# Patient Record
Sex: Female | Born: 1975 | Race: White | Hispanic: No | Smoking: Current every day smoker
Health system: Southern US, Community
[De-identification: ages and names within clinical notes are randomized; demographics above are authoritative.]

## PROBLEM LIST (undated history)

## (undated) DIAGNOSIS — N611 Abscess of the breast and nipple: Secondary | ICD-10-CM

## (undated) DIAGNOSIS — I10 Essential (primary) hypertension: Secondary | ICD-10-CM

## (undated) DIAGNOSIS — J189 Pneumonia, unspecified organism: Secondary | ICD-10-CM

## (undated) DIAGNOSIS — R569 Unspecified convulsions: Secondary | ICD-10-CM

## (undated) DIAGNOSIS — F1010 Alcohol abuse, uncomplicated: Secondary | ICD-10-CM

## (undated) DIAGNOSIS — Z720 Tobacco use: Secondary | ICD-10-CM

## (undated) DIAGNOSIS — A4902 Methicillin resistant Staphylococcus aureus infection, unspecified site: Secondary | ICD-10-CM

## (undated) HISTORY — PX: INCISION AND DRAINAGE: SHX5863

## (undated) HISTORY — PX: TUBAL LIGATION: SHX77

## (undated) HISTORY — PX: OTHER SURGICAL HISTORY: SHX169

---

## 2005-06-30 ENCOUNTER — Emergency Department: Payer: Self-pay | Admitting: Emergency Medicine

## 2005-07-01 ENCOUNTER — Emergency Department: Payer: Self-pay | Admitting: Emergency Medicine

## 2005-07-28 ENCOUNTER — Emergency Department: Payer: Self-pay | Admitting: Emergency Medicine

## 2006-01-17 ENCOUNTER — Emergency Department: Payer: Self-pay | Admitting: General Practice

## 2008-05-03 ENCOUNTER — Emergency Department: Payer: Self-pay | Admitting: Emergency Medicine

## 2008-06-08 ENCOUNTER — Emergency Department: Payer: Self-pay | Admitting: Emergency Medicine

## 2008-06-19 ENCOUNTER — Emergency Department: Payer: Self-pay | Admitting: Emergency Medicine

## 2008-06-20 ENCOUNTER — Other Ambulatory Visit: Payer: Self-pay

## 2008-07-27 ENCOUNTER — Emergency Department: Payer: Self-pay

## 2008-09-14 ENCOUNTER — Emergency Department: Payer: Self-pay | Admitting: Emergency Medicine

## 2008-10-28 ENCOUNTER — Emergency Department: Payer: Self-pay | Admitting: Emergency Medicine

## 2010-02-22 ENCOUNTER — Emergency Department: Payer: Self-pay

## 2010-05-02 ENCOUNTER — Emergency Department: Payer: Self-pay | Admitting: Internal Medicine

## 2010-06-05 ENCOUNTER — Emergency Department: Payer: Self-pay | Admitting: Emergency Medicine

## 2011-02-01 ENCOUNTER — Inpatient Hospital Stay: Payer: Self-pay | Admitting: Psychiatry

## 2018-04-06 ENCOUNTER — Emergency Department (HOSPITAL_BASED_OUTPATIENT_CLINIC_OR_DEPARTMENT_OTHER): Payer: Self-pay

## 2018-04-06 ENCOUNTER — Inpatient Hospital Stay (HOSPITAL_BASED_OUTPATIENT_CLINIC_OR_DEPARTMENT_OTHER)
Admission: EM | Admit: 2018-04-06 | Discharge: 2018-04-10 | DRG: 871 | Disposition: A | Discharge status: Home / Self Care | Payer: Self-pay | Attending: Internal Medicine | Admitting: Internal Medicine

## 2018-04-06 ENCOUNTER — Other Ambulatory Visit: Payer: Self-pay

## 2018-04-06 ENCOUNTER — Encounter (HOSPITAL_BASED_OUTPATIENT_CLINIC_OR_DEPARTMENT_OTHER): Payer: Self-pay | Admitting: *Deleted

## 2018-04-06 DIAGNOSIS — Z87898 Personal history of other specified conditions: Secondary | ICD-10-CM

## 2018-04-06 DIAGNOSIS — F10939 Alcohol use, unspecified with withdrawal, unspecified: Secondary | ICD-10-CM

## 2018-04-06 DIAGNOSIS — R062 Wheezing: Secondary | ICD-10-CM

## 2018-04-06 DIAGNOSIS — F17210 Nicotine dependence, cigarettes, uncomplicated: Secondary | ICD-10-CM | POA: Diagnosis present

## 2018-04-06 DIAGNOSIS — E876 Hypokalemia: Secondary | ICD-10-CM | POA: Diagnosis present

## 2018-04-06 DIAGNOSIS — N611 Abscess of the breast and nipple: Secondary | ICD-10-CM | POA: Diagnosis present

## 2018-04-06 DIAGNOSIS — A419 Sepsis, unspecified organism: Principal | ICD-10-CM | POA: Diagnosis present

## 2018-04-06 DIAGNOSIS — Z809 Family history of malignant neoplasm, unspecified: Secondary | ICD-10-CM

## 2018-04-06 DIAGNOSIS — J180 Bronchopneumonia, unspecified organism: Secondary | ICD-10-CM | POA: Diagnosis present

## 2018-04-06 DIAGNOSIS — R0902 Hypoxemia: Secondary | ICD-10-CM | POA: Diagnosis present

## 2018-04-06 DIAGNOSIS — F10239 Alcohol dependence with withdrawal, unspecified: Secondary | ICD-10-CM | POA: Diagnosis present

## 2018-04-06 DIAGNOSIS — J181 Lobar pneumonia, unspecified organism: Secondary | ICD-10-CM

## 2018-04-06 DIAGNOSIS — Z720 Tobacco use: Secondary | ICD-10-CM

## 2018-04-06 DIAGNOSIS — D649 Anemia, unspecified: Secondary | ICD-10-CM | POA: Diagnosis present

## 2018-04-06 DIAGNOSIS — J189 Pneumonia, unspecified organism: Secondary | ICD-10-CM | POA: Diagnosis present

## 2018-04-06 DIAGNOSIS — E872 Acidosis: Secondary | ICD-10-CM | POA: Diagnosis present

## 2018-04-06 HISTORY — DX: Alcohol abuse, uncomplicated: F10.10

## 2018-04-06 HISTORY — DX: Tobacco use: Z72.0

## 2018-04-06 HISTORY — DX: Abscess of the breast and nipple: N61.1

## 2018-04-06 LAB — I-STAT CG4 LACTIC ACID, ED: Lactic Acid, Venous: 3.7 mmol/L (ref 0.5–1.9)

## 2018-04-06 LAB — CBC WITH DIFFERENTIAL/PLATELET
BASOS PCT: 0 %
Basophils Absolute: 0 10*3/uL (ref 0.0–0.1)
EOS PCT: 0 %
Eosinophils Absolute: 0 10*3/uL (ref 0.0–0.7)
HEMATOCRIT: 34.4 % — AB (ref 36.0–46.0)
Hemoglobin: 11.5 g/dL — ABNORMAL LOW (ref 12.0–15.0)
Lymphocytes Relative: 9 %
Lymphs Abs: 1.9 10*3/uL (ref 0.7–4.0)
MCH: 30.3 pg (ref 26.0–34.0)
MCHC: 33.4 g/dL (ref 30.0–36.0)
MCV: 90.8 fL (ref 78.0–100.0)
MONO ABS: 2.4 10*3/uL — AB (ref 0.1–1.0)
Monocytes Relative: 11 %
NEUTROS ABS: 17.2 10*3/uL — AB (ref 1.7–7.7)
Neutrophils Relative %: 80 %
Platelets: 339 10*3/uL (ref 150–400)
RBC: 3.79 MIL/uL — ABNORMAL LOW (ref 3.87–5.11)
RDW: 22.1 % — AB (ref 11.5–15.5)
WBC: 21.5 10*3/uL — ABNORMAL HIGH (ref 4.0–10.5)

## 2018-04-06 LAB — COMPREHENSIVE METABOLIC PANEL
ALT: 11 U/L — AB (ref 14–54)
AST: 24 U/L (ref 15–41)
Albumin: 3.4 g/dL — ABNORMAL LOW (ref 3.5–5.0)
Alkaline Phosphatase: 116 U/L (ref 38–126)
Anion gap: 14 (ref 5–15)
BUN: <5 mg/dL — ABNORMAL LOW (ref 6–20)
CHLORIDE: 100 mmol/L — AB (ref 101–111)
CO2: 21 mmol/L — ABNORMAL LOW (ref 22–32)
CREATININE: 0.62 mg/dL (ref 0.44–1.00)
Calcium: 8.8 mg/dL — ABNORMAL LOW (ref 8.9–10.3)
GFR calc Af Amer: >60 mL/min (ref >60)
GFR calc non Af Amer: >60 mL/min (ref >60)
Glucose, Bld: 126 mg/dL — ABNORMAL HIGH (ref 65–99)
Potassium: 3.1 mmol/L — ABNORMAL LOW (ref 3.5–5.1)
Sodium: 135 mmol/L (ref 135–145)
Total Bilirubin: 1 mg/dL (ref 0.3–1.2)
Total Protein: 7.8 g/dL (ref 6.5–8.1)

## 2018-04-06 MED ORDER — LEVOFLOXACIN IN D5W 500 MG/100ML IV SOLN
500.0000 mg | Freq: Once | INTRAVENOUS | Status: AC
  Administered 2018-04-06: 500 mg via INTRAVENOUS
  Filled 2018-04-06: qty 100

## 2018-04-06 MED ORDER — SODIUM CHLORIDE 0.9 % IV BOLUS
1000.0000 mL | Freq: Once | INTRAVENOUS | Status: AC
  Administered 2018-04-06: 1000 mL via INTRAVENOUS

## 2018-04-06 MED ORDER — ACETAMINOPHEN 325 MG PO TABS
650.0000 mg | ORAL_TABLET | Freq: Once | ORAL | Status: AC
  Administered 2018-04-06: 650 mg via ORAL
  Filled 2018-04-06: qty 2

## 2018-04-06 MED ORDER — IPRATROPIUM-ALBUTEROL 0.5-2.5 (3) MG/3ML IN SOLN
3.0000 mL | Freq: Once | RESPIRATORY_TRACT | Status: AC
  Administered 2018-04-06: 3 mL via RESPIRATORY_TRACT
  Filled 2018-04-06: qty 3

## 2018-04-06 NOTE — ED Notes (Signed)
ED Provider at bedside. 

## 2018-04-06 NOTE — ED Triage Notes (Signed)
Pt reports cough and SOB that started today. No acute distress noted. Ambulatory.

## 2018-04-06 NOTE — ED Provider Notes (Signed)
[facility] EMERGENCY DEPARTMENT Provider Note   CSN: [id number] Arrival date & time: 04/06/18  2114     History   Chief Complaint Chief Complaint  Patient presents with  . URI    HPI Megan Solis is a 42 y.o. female.  HPI  This is a 42 year old female who presents with cough and shortness of breath.  Patient reports onset of symptoms several days ago.  She states "I have had a cold."  She reports a nonproductive cough.  She reports worsening shortness of breath and chest tightness that worsened prior to arrival.  She is a current every day smoker.  She has a family member that has had similar symptoms.  She reports some posttussive emesis.  Denies any diarrhea.  Reports chills without documented fevers.  History reviewed. No pertinent past medical history.  Patient Active Problem List   Diagnosis Date Noted  . Sepsis (HCC) 04/07/2018    History reviewed. No pertinent surgical history.   OB History   None      Home Medications    Prior to Admission medications   Not on File    Family History History reviewed. No pertinent family history.  Social History Social History   Tobacco Use  . Smoking status: Current Every Day Smoker    Packs/day: 1.00    Types: Cigarettes  . Smokeless tobacco: Never Used  Substance Use Topics  . Alcohol use: Yes  . Drug use: Not Currently     Allergies   Patient has no known allergies.   Review of Systems Review of Systems  Constitutional: Positive for chills and fever.  Respiratory: Positive for cough, chest tightness, shortness of breath and wheezing.   Cardiovascular: Negative for chest pain and leg swelling.  Gastrointestinal: Positive for vomiting. Negative for abdominal pain, diarrhea and nausea.  Genitourinary: Negative for dysuria.  Musculoskeletal: Negative for neck stiffness.  Neurological: Negative for headaches.  All other systems reviewed and are negative.    Physical Exam Updated  Vital Signs BP 104/82   Pulse (!) 132   Temp 99.5 F (37.5 C) (Oral)   Resp (!) 32   Ht 5\' 4"  (1.626 m)   Wt 72.6 kg (160 lb)   LMP 04/04/2018   SpO2 94%   BMI 27.46 kg/m   Physical Exam  Constitutional: She is oriented to person, place, and time. No distress.  Non-ill-appearing  HENT:  Head: Normocephalic and atraumatic.  Eyes: Pupils are equal, round, and reactive to light.  Cardiovascular: Regular rhythm and normal heart sounds.  Tachycardia  Pulmonary/Chest: Effort normal. No respiratory distress. She has wheezes.  Expiratory wheezing noted in all lung fields, fair air movement, Rales right lower lobe  Abdominal: Soft. Bowel sounds are normal. There is no tenderness.  Musculoskeletal: She exhibits no edema.  Neurological: She is alert and oriented to person, place, and time.  Skin: Skin is warm and dry.  Psychiatric: She has a normal mood and affect.  Nursing note and vitals reviewed.    ED Treatments / Results  Labs (all labs ordered are listed, but only abnormal results are displayed) Labs Reviewed  COMPREHENSIVE METABOLIC PANEL - Abnormal; Notable for the following components:      Result Value   Potassium 3.1 (*)    Chloride 100 (*)    CO2 21 (*)    Glucose, Bld 126 (*)    BUN <5 (*)    Calcium 8.8 (*)    Albumin 3.4 (*)  ALT 11 (*)    All other components within normal limits  CBC WITH DIFFERENTIAL/PLATELET - Abnormal; Notable for the following components:   WBC 21.5 (*)    RBC 3.79 (*)    Hemoglobin 11.5 (*)    HCT 34.4 (*)    RDW 22.1 (*)    Neutro Abs 17.2 (*)    Monocytes Absolute 2.4 (*)    All other components within normal limits  I-STAT CG4 LACTIC ACID, ED - Abnormal; Notable for the following components:   Lactic Acid, Venous 3.70 (*)    All other components within normal limits  I-STAT CG4 LACTIC ACID, ED - Abnormal; Notable for the following components:   Lactic Acid, Venous 3.04 (*)    All other components within normal limits    URINALYSIS, ROUTINE W REFLEX MICROSCOPIC  INFLUENZA PANEL BY PCR (TYPE A & B)    EKG EKG Interpretation  Date/Time:  Sunday April 06 2018 23:04:36 EDT Ventricular Rate:  124 PR Interval:    QRS Duration: 82 QT Interval:  313 QTC Calculation: 450 R Axis:   62 Text Interpretation:  Sinus tachycardia Ventricular premature complex Aberrant conduction of SV complex(es) Probable left atrial enlargement Anteroseptal infarct, old Minimal ST depression, inferior leads Likely rate related Confirmed by Ross Marcus ([id number]) on 04/06/2018 11:08:01 PM   Radiology Dg Chest 2 View  Result Date: 04/06/2018 CLINICAL DATA:  Cough, shortness of breath, low-grade fever EXAM: CHEST - 2 VIEW COMPARISON:  None. FINDINGS: Mild patchy opacities in the bilateral lower lungs, likely in the lingula and right middle lobe, worrisome for mild infection. No pleural effusion or pneumothorax. The heart is normal in size. Visualized osseous structures are within normal limits. IMPRESSION: Mild patchy opacities in the bilateral lower lungs, likely in the lingula and right middle lobe, worrisome for mild infection. Electronically Signed   By: Charline Bills M.D.   On: 04/06/2018 21:59    Procedures Procedures (including critical care time)  CRITICAL CARE Performed by: Shon Baton   Total critical care time: 35 minutes  Critical care time was exclusive of separately billable procedures and treating other patients.  Critical care was necessary to treat or prevent imminent or life-threatening deterioration.  Critical care was time spent personally by me on the following activities: development of treatment plan with patient and/or surrogate as well as nursing, discussions with consultants, evaluation of patient's response to treatment, examination of patient, obtaining history from patient or surrogate, ordering and performing treatments and interventions, ordering and review of laboratory studies,  ordering and review of radiographic studies, pulse oximetry and re-evaluation of patient's condition.   Medications Ordered in ED Medications  sodium chloride 0.9 % bolus 1,000 mL (has no administration in time range)  sodium chloride 0.9 % bolus 1,000 mL (1,000 mLs Intravenous New Bag/Given 04/06/18 2312)  acetaminophen (TYLENOL) tablet 650 mg (650 mg Oral Given 04/06/18 2318)  levofloxacin (LEVAQUIN) IVPB 500 mg (500 mg Intravenous New Bag/Given 04/06/18 2318)  ipratropium-albuterol (DUONEB) 0.5-2.5 (3) MG/3ML nebulizer solution 3 mL (3 mLs Nebulization Given 04/06/18 2325)  albuterol (PROVENTIL,VENTOLIN) solution continuous neb (15 mg/hr Nebulization Given 04/07/18 0119)     Initial Impression / Assessment and Plan / ED Course  I have reviewed the triage vital signs and the nursing notes.  Pertinent labs & imaging results that were available during my care of the patient were reviewed by me and considered in my medical decision making (see chart for details).     Patient presents with  shortness of breath.  Noted to be tachycardic and febrile to 102.  She is in no significant respiratory distress; however, she does have wheezing in all lung fields and rales in the right lower lobe.  Leukocytosis to 21.  Chest x-ray is concerning for right mid and lower lobe pneumonia.  Patient was also given prednisone and a DuoNeb given wheezing and smoking history.  Patient initially requesting discharge.  However, she is persistently tachycardic after fluids and fever control.  She also had a lactate of 3.7.  She has never been hypotensive.  She was given Levaquin for pneumonia and likely early sepsis.  When she ambulated, her heart rate went to 140s.  She was dyspneic.  On recheck, O2 sats 90% on room air.  She at this time is not requiring supplemental oxygen but continues to wheeze and feel short of breath.  For this reason, will admit.  Flu testing was sent and is pending.  Discussed with Dr. Katrinka BlazingSmith,  hospitalist.  Final Clinical Impressions(s) / ED Diagnoses   Final diagnoses:  Community acquired pneumonia of right lower lobe of lung [facility](HCC)  Hypoxia  Wheezing    ED Discharge Orders    None       Shon BatonHorton, Courtney F, MD 04/07/18 0139

## 2018-04-07 ENCOUNTER — Inpatient Hospital Stay (HOSPITAL_COMMUNITY): Payer: Self-pay

## 2018-04-07 ENCOUNTER — Encounter (HOSPITAL_COMMUNITY): Payer: Self-pay | Admitting: Family Medicine

## 2018-04-07 DIAGNOSIS — F10939 Alcohol use, unspecified with withdrawal, unspecified: Secondary | ICD-10-CM

## 2018-04-07 DIAGNOSIS — Z87898 Personal history of other specified conditions: Secondary | ICD-10-CM

## 2018-04-07 DIAGNOSIS — A419 Sepsis, unspecified organism: Secondary | ICD-10-CM | POA: Diagnosis present

## 2018-04-07 DIAGNOSIS — Z720 Tobacco use: Secondary | ICD-10-CM

## 2018-04-07 DIAGNOSIS — F10239 Alcohol dependence with withdrawal, unspecified: Secondary | ICD-10-CM

## 2018-04-07 DIAGNOSIS — J189 Pneumonia, unspecified organism: Secondary | ICD-10-CM | POA: Diagnosis present

## 2018-04-07 LAB — RAPID URINE DRUG SCREEN, HOSP PERFORMED
AMPHETAMINES: NOT DETECTED
Barbiturates: NOT DETECTED
Benzodiazepines: NOT DETECTED
Cocaine: NOT DETECTED
OPIATES: NOT DETECTED
TETRAHYDROCANNABINOL: POSITIVE — AB

## 2018-04-07 LAB — COMPREHENSIVE METABOLIC PANEL
ALT: 9 U/L — AB (ref 14–54)
AST: 23 U/L (ref 15–41)
Albumin: 2.8 g/dL — ABNORMAL LOW (ref 3.5–5.0)
Alkaline Phosphatase: 110 U/L (ref 38–126)
Anion gap: 13 (ref 5–15)
BILIRUBIN TOTAL: 1 mg/dL (ref 0.3–1.2)
BUN: <5 mg/dL — ABNORMAL LOW (ref 6–20)
CALCIUM: 8.7 mg/dL — AB (ref 8.9–10.3)
CO2: 21 mmol/L — ABNORMAL LOW (ref 22–32)
CREATININE: 0.57 mg/dL (ref 0.44–1.00)
Chloride: 106 mmol/L (ref 101–111)
Glucose, Bld: 153 mg/dL — ABNORMAL HIGH (ref 65–99)
Potassium: 3.3 mmol/L — ABNORMAL LOW (ref 3.5–5.1)
Sodium: 140 mmol/L (ref 135–145)
TOTAL PROTEIN: 6.8 g/dL (ref 6.5–8.1)

## 2018-04-07 LAB — CBC WITH DIFFERENTIAL/PLATELET
BASOS PCT: 0 %
Basophils Absolute: 0 10*3/uL (ref 0.0–0.1)
Eosinophils Absolute: 0 10*3/uL (ref 0.0–0.7)
Eosinophils Relative: 0 %
HEMATOCRIT: 32.2 % — AB (ref 36.0–46.0)
HEMOGLOBIN: 10.2 g/dL — AB (ref 12.0–15.0)
Lymphocytes Relative: 8 %
Lymphs Abs: 1.6 10*3/uL (ref 0.7–4.0)
MCH: 29.3 pg (ref 26.0–34.0)
MCHC: 31.7 g/dL (ref 30.0–36.0)
MCV: 92.5 fL (ref 78.0–100.0)
MONOS PCT: 9 %
Monocytes Absolute: 1.7 10*3/uL — ABNORMAL HIGH (ref 0.1–1.0)
NEUTROS PCT: 83 %
Neutro Abs: 16.1 10*3/uL — ABNORMAL HIGH (ref 1.7–7.7)
Platelets: 319 10*3/uL (ref 150–400)
RBC: 3.48 MIL/uL — AB (ref 3.87–5.11)
RDW: 22.3 % — ABNORMAL HIGH (ref 11.5–15.5)
WBC: 19.4 10*3/uL — AB (ref 4.0–10.5)

## 2018-04-07 LAB — LACTIC ACID, PLASMA
LACTIC ACID, VENOUS: 4.5 mmol/L — AB (ref 0.5–1.9)
Lactic Acid, Venous: 5 mmol/L (ref 0.5–1.9)
Lactic Acid, Venous: 3 mmol/L (ref 0.5–1.9)
Lactic Acid, Venous: 1.7 mmol/L (ref 0.5–1.9)

## 2018-04-07 LAB — INFLUENZA PANEL BY PCR (TYPE A & B)
INFLAPCR: NEGATIVE
Influenza B By PCR: NEGATIVE

## 2018-04-07 LAB — MRSA PCR SCREENING: MRSA by PCR: NEGATIVE

## 2018-04-07 LAB — MAGNESIUM: Magnesium: 1.3 mg/dL — ABNORMAL LOW (ref 1.7–2.4)

## 2018-04-07 LAB — EXPECTORATED SPUTUM ASSESSMENT W REFEX TO RESP CULTURE

## 2018-04-07 LAB — I-STAT CG4 LACTIC ACID, ED: LACTIC ACID, VENOUS: 3.04 mmol/L — AB (ref 0.5–1.9)

## 2018-04-07 LAB — TROPONIN I

## 2018-04-07 LAB — STREP PNEUMONIAE URINARY ANTIGEN: Strep Pneumo Urinary Antigen: NEGATIVE

## 2018-04-07 LAB — EXPECTORATED SPUTUM ASSESSMENT W GRAM STAIN, RFLX TO RESP C

## 2018-04-07 LAB — LIPASE, BLOOD: LIPASE: 30 U/L (ref 11–51)

## 2018-04-07 LAB — HIV ANTIBODY (ROUTINE TESTING W REFLEX): HIV Screen 4th Generation wRfx: NONREACTIVE

## 2018-04-07 MED ORDER — FOLIC ACID 1 MG PO TABS
1.0000 mg | ORAL_TABLET | Freq: Every day | ORAL | Status: DC
  Administered 2018-04-07 – 2018-04-10 (×4): 1 mg via ORAL
  Filled 2018-04-07 (×4): qty 1

## 2018-04-07 MED ORDER — NICOTINE 21 MG/24HR TD PT24
21.0000 mg | MEDICATED_PATCH | Freq: Every day | TRANSDERMAL | Status: DC
  Administered 2018-04-07 – 2018-04-10 (×4): 21 mg via TRANSDERMAL
  Filled 2018-04-07 (×4): qty 1

## 2018-04-07 MED ORDER — PNEUMOCOCCAL VAC POLYVALENT 25 MCG/0.5ML IJ INJ: 0.5000 mL | INJECTION | INTRAMUSCULAR | Status: DC | PRN

## 2018-04-07 MED ORDER — MAGNESIUM SULFATE 4 GM/100ML IV SOLN
4.0000 g | Freq: Once | INTRAVENOUS | Status: AC
  Administered 2018-04-07: 4 g via INTRAVENOUS
  Filled 2018-04-07: qty 100

## 2018-04-07 MED ORDER — CHLORDIAZEPOXIDE HCL 5 MG PO CAPS
25.0000 mg | ORAL_CAPSULE | Freq: Three times a day (TID) | ORAL | Status: AC
  Administered 2018-04-08 – 2018-04-09 (×3): 25 mg via ORAL
  Filled 2018-04-07 (×3): qty 5

## 2018-04-07 MED ORDER — CHLORDIAZEPOXIDE HCL 5 MG PO CAPS
25.0000 mg | ORAL_CAPSULE | ORAL | Status: AC
  Administered 2018-04-09 – 2018-04-10 (×2): 25 mg via ORAL
  Filled 2018-04-07 (×2): qty 5

## 2018-04-07 MED ORDER — LACTATED RINGERS IV SOLN
INTRAVENOUS | Status: DC
  Administered 2018-04-07: 125 mL/h via INTRAVENOUS
  Administered 2018-04-07: 10:00:00 via INTRAVENOUS
  Administered 2018-04-08: 125 mL/h via INTRAVENOUS

## 2018-04-07 MED ORDER — CHLORDIAZEPOXIDE HCL 5 MG PO CAPS: 25.0000 mg | ORAL_CAPSULE | Freq: Every day | ORAL | Status: DC

## 2018-04-07 MED ORDER — LORAZEPAM 1 MG PO TABS: 1.0000 mg | ORAL_TABLET | Freq: Four times a day (QID) | ORAL | Status: AC | PRN

## 2018-04-07 MED ORDER — IOPAMIDOL (ISOVUE-300) INJECTION 61%
INTRAVENOUS | Status: AC
  Filled 2018-04-07: qty 30

## 2018-04-07 MED ORDER — PREDNISONE 20 MG PO TABS
40.0000 mg | ORAL_TABLET | Freq: Every day | ORAL | Status: DC
  Administered 2018-04-07: 40 mg via ORAL
  Filled 2018-04-07: qty 2

## 2018-04-07 MED ORDER — POTASSIUM CHLORIDE CRYS ER 20 MEQ PO TBCR
40.0000 meq | EXTENDED_RELEASE_TABLET | ORAL | Status: AC
  Administered 2018-04-07: 40 meq via ORAL
  Filled 2018-04-07: qty 2

## 2018-04-07 MED ORDER — PREDNISONE 20 MG PO TABS: 40.0000 mg | ORAL_TABLET | Freq: Every day | ORAL | Status: DC

## 2018-04-07 MED ORDER — ACETAMINOPHEN 325 MG PO TABS
650.0000 mg | ORAL_TABLET | Freq: Four times a day (QID) | ORAL | Status: DC | PRN
  Administered 2018-04-07 – 2018-04-10 (×5): 650 mg via ORAL
  Filled 2018-04-07 (×5): qty 2

## 2018-04-07 MED ORDER — SODIUM CHLORIDE 0.9 % IV SOLN
INTRAVENOUS | Status: DC
  Administered 2018-04-07: 04:00:00 via INTRAVENOUS

## 2018-04-07 MED ORDER — IPRATROPIUM-ALBUTEROL 0.5-2.5 (3) MG/3ML IN SOLN
3.0000 mL | RESPIRATORY_TRACT | Status: DC | PRN
  Administered 2018-04-08 – 2018-04-09 (×3): 3 mL via RESPIRATORY_TRACT
  Filled 2018-04-07 (×3): qty 3

## 2018-04-07 MED ORDER — ENOXAPARIN SODIUM 40 MG/0.4ML ~~LOC~~ SOLN
40.0000 mg | SUBCUTANEOUS | Status: DC
  Filled 2018-04-07 (×3): qty 0.4

## 2018-04-07 MED ORDER — ADULT MULTIVITAMIN W/MINERALS CH
1.0000 | ORAL_TABLET | Freq: Every day | ORAL | Status: DC
  Administered 2018-04-07 – 2018-04-10 (×4): 1 via ORAL
  Filled 2018-04-07 (×4): qty 1

## 2018-04-07 MED ORDER — LEVOFLOXACIN IN D5W 500 MG/100ML IV SOLN: 500.0000 mg | INTRAVENOUS | Status: DC

## 2018-04-07 MED ORDER — POTASSIUM CHLORIDE CRYS ER 20 MEQ PO TBCR
40.0000 meq | EXTENDED_RELEASE_TABLET | Freq: Once | ORAL | Status: AC
  Administered 2018-04-07: 40 meq via ORAL
  Filled 2018-04-07: qty 2

## 2018-04-07 MED ORDER — ALBUTEROL (5 MG/ML) CONTINUOUS INHALATION SOLN
15.0000 mg/h | INHALATION_SOLUTION | Freq: Once | RESPIRATORY_TRACT | Status: AC
  Administered 2018-04-07: 15 mg/h via RESPIRATORY_TRACT
  Filled 2018-04-07: qty 20

## 2018-04-07 MED ORDER — THIAMINE HCL 100 MG/ML IJ SOLN: 100.0000 mg | Freq: Every day | INTRAMUSCULAR | Status: DC

## 2018-04-07 MED ORDER — CHLORDIAZEPOXIDE HCL 25 MG PO CAPS
25.0000 mg | ORAL_CAPSULE | Freq: Four times a day (QID) | ORAL | Status: AC
  Administered 2018-04-07 – 2018-04-08 (×4): 25 mg via ORAL
  Filled 2018-04-07 (×4): qty 1

## 2018-04-07 MED ORDER — LEVOFLOXACIN IN D5W 750 MG/150ML IV SOLN
750.0000 mg | INTRAVENOUS | Status: DC
  Administered 2018-04-07: 750 mg via INTRAVENOUS
  Filled 2018-04-07: qty 150

## 2018-04-07 MED ORDER — LACTATED RINGERS IV BOLUS
2000.0000 mL | Freq: Once | INTRAVENOUS | Status: AC
  Administered 2018-04-07: 2000 mL via INTRAVENOUS

## 2018-04-07 MED ORDER — VITAMIN B-1 100 MG PO TABS
100.0000 mg | ORAL_TABLET | Freq: Every day | ORAL | Status: DC
  Administered 2018-04-07 – 2018-04-10 (×4): 100 mg via ORAL
  Filled 2018-04-07 (×4): qty 1

## 2018-04-07 MED ORDER — IOPAMIDOL (ISOVUE-300) INJECTION 61%: 15.0000 mL | Freq: Once | INTRAVENOUS | Status: AC | PRN

## 2018-04-07 MED ORDER — IOPAMIDOL (ISOVUE-370) INJECTION 76%
INTRAVENOUS | Status: AC
  Filled 2018-04-07: qty 100

## 2018-04-07 MED ORDER — LORAZEPAM 2 MG/ML IJ SOLN
1.0000 mg | Freq: Four times a day (QID) | INTRAMUSCULAR | Status: AC | PRN
  Administered 2018-04-08: 1 mg via INTRAVENOUS
  Filled 2018-04-07: qty 1

## 2018-04-07 MED ORDER — SODIUM CHLORIDE 0.9 % IV BOLUS
1000.0000 mL | Freq: Once | INTRAVENOUS | Status: AC
Start: 2018-04-07 — End: 2018-04-07
  Administered 2018-04-07: 1000 mL via INTRAVENOUS

## 2018-04-07 MED ORDER — IOPAMIDOL (ISOVUE-370) INJECTION 76%
100.0000 mL | Freq: Once | INTRAVENOUS | Status: AC | PRN
  Administered 2018-04-07: 100 mL via INTRAVENOUS

## 2018-04-07 NOTE — ED Notes (Signed)
Pt went to restroom but did not urinate in cup.

## 2018-04-07 NOTE — Progress Notes (Addendum)
eLink Physician-Brief Progress Note Patient Name: Megan Solis DOB: 04-18-1976 MRN: [id number]   Date of Service  04/07/2018  HPI/Events of Note  Recent Labs  Lab 04/06/18 2230 04/07/18 0114 04/07/18 0445  LATICACIDVEN 3.70* 3.04* 5.0*   Recent Labs  Lab 04/06/18 2221  WBC 21.5*     eICU Interventions  Rinsing lactate  Plan 2L LR bolus  - recheck -> if not responding call CCM   Check urine leg/strep and RVP and HIV/repeat lactate and trop at 9am 04/07/2018       Intervention Category Intermediate Interventions: Diagnostic test evaluation  Lekeshia Kram 04/07/2018, 5:59 AM

## 2018-04-07 NOTE — ED Notes (Signed)
Pt asking to go home. EDP notified.

## 2018-04-07 NOTE — Plan of Care (Signed)
Received signout [facility] Ms. Megan Solis is a 42 year old female with past medical history significant for tobacco abuse; who presents with complaints of shortness of breath and intermittent cough.  Vital signs: Temperature up to 102 F, heart rates 120-132, respirations up to 32, and O2 saturation 90-98% on room air.  Labs revealed WBC 21.5, hemoglobin 11.5, potassium 3.1, lactic acid 3.7 initially.  Patient was given 2 L normal saline IV fluids of 500 mL of, Levaquin, Tylenol, and DuoNeb breathing treatments.  [facility] called to admit accepted to a stepdown bed [facility] at [facility].  Influenza screen is pending.
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
## 2018-04-07 NOTE — ED Notes (Signed)
Ambulated patient per physician request. Patient resting oxygen saturations were 92% with heart rate of 124. Upon ambulating patient around department patient oxygen saturations increased to 94% with a heart rate of 141. Patient paused during ambulation complaining of SOB. Patient returned to room and placed back on monitor. Physician notified.

## 2018-04-08 DIAGNOSIS — J189 Pneumonia, unspecified organism: Secondary | ICD-10-CM

## 2018-04-08 LAB — EXPECTORATED SPUTUM ASSESSMENT W REFEX TO RESP CULTURE

## 2018-04-08 LAB — CBC
HEMATOCRIT: 32 % — AB (ref 36.0–46.0)
HEMOGLOBIN: 10.2 g/dL — AB (ref 12.0–15.0)
MCH: 29.2 pg (ref 26.0–34.0)
MCHC: 31.9 g/dL (ref 30.0–36.0)
MCV: 91.7 fL (ref 78.0–100.0)
Platelets: 347 10*3/uL (ref 150–400)
RBC: 3.49 MIL/uL — ABNORMAL LOW (ref 3.87–5.11)
RDW: 22.3 % — ABNORMAL HIGH (ref 11.5–15.5)
WBC: 15.3 10*3/uL — ABNORMAL HIGH (ref 4.0–10.5)

## 2018-04-08 LAB — COMPREHENSIVE METABOLIC PANEL
ALBUMIN: 2.5 g/dL — AB (ref 3.5–5.0)
ALT: 10 U/L — ABNORMAL LOW (ref 14–54)
ANION GAP: 10 (ref 5–15)
AST: 17 U/L (ref 15–41)
Alkaline Phosphatase: 113 U/L (ref 38–126)
BILIRUBIN TOTAL: 0.9 mg/dL (ref 0.3–1.2)
BUN: <5 mg/dL — ABNORMAL LOW (ref 6–20)
CO2: 24 mmol/L (ref 22–32)
Calcium: 8.8 mg/dL — ABNORMAL LOW (ref 8.9–10.3)
Chloride: 106 mmol/L (ref 101–111)
Creatinine, Ser: 0.53 mg/dL (ref 0.44–1.00)
GFR calc Af Amer: >60 mL/min (ref >60)
GFR calc non Af Amer: >60 mL/min (ref >60)
GLUCOSE: 115 mg/dL — AB (ref 65–99)
POTASSIUM: 3.4 mmol/L — AB (ref 3.5–5.1)
SODIUM: 140 mmol/L (ref 135–145)
TOTAL PROTEIN: 6.5 g/dL (ref 6.5–8.1)

## 2018-04-08 LAB — LEGIONELLA PNEUMOPHILA SEROGP 1 UR AG: L. PNEUMOPHILA SEROGP 1 UR AG: NEGATIVE

## 2018-04-08 LAB — IRON AND TIBC
Iron: 29 ug/dL (ref 28–170)
SATURATION RATIOS: 11 % (ref 10.4–31.8)
TIBC: 272 ug/dL (ref 250–450)
UIBC: 243 ug/dL

## 2018-04-08 LAB — TROPONIN I

## 2018-04-08 LAB — FERRITIN: Ferritin: 80 ng/mL (ref 11–307)

## 2018-04-08 LAB — RETICULOCYTES
RBC.: 3.46 MIL/uL — ABNORMAL LOW (ref 3.87–5.11)
RETIC CT PCT: 2.5 % (ref 0.4–3.1)
Retic Count, Absolute: 86.5 10*3/uL (ref 19.0–186.0)

## 2018-04-08 LAB — VITAMIN B12: VITAMIN B 12: 297 pg/mL (ref 180–914)

## 2018-04-08 LAB — FOLATE: FOLATE: 21.9 ng/mL (ref >5.9)

## 2018-04-08 LAB — MAGNESIUM: Magnesium: 1.8 mg/dL (ref 1.7–2.4)

## 2018-04-08 LAB — EXPECTORATED SPUTUM ASSESSMENT W GRAM STAIN, RFLX TO RESP C

## 2018-04-08 MED ORDER — POTASSIUM CHLORIDE IN NACL 40-0.9 MEQ/L-% IV SOLN
INTRAVENOUS | Status: DC
  Administered 2018-04-08 (×2): 75 mL/h via INTRAVENOUS
  Filled 2018-04-08 (×3): qty 1000

## 2018-04-08 MED ORDER — ALUM & MAG HYDROXIDE-SIMETH 200-200-20 MG/5ML PO SUSP
30.0000 mL | ORAL | Status: DC | PRN
  Administered 2018-04-08: 30 mL via ORAL
  Filled 2018-04-08: qty 30

## 2018-04-08 MED ORDER — SODIUM CHLORIDE 0.9 % IV SOLN
INTRAVENOUS | Status: DC
  Filled 2018-04-08: qty 1000

## 2018-04-08 MED ORDER — CEFTRIAXONE SODIUM 1 G IJ SOLR
1.0000 g | INTRAMUSCULAR | Status: DC
  Administered 2018-04-08 – 2018-04-10 (×3): 1 g via INTRAVENOUS
  Filled 2018-04-08 (×3): qty 1

## 2018-04-08 MED ORDER — SODIUM CHLORIDE 0.9 % IV SOLN
500.0000 mg | INTRAVENOUS | Status: DC
  Administered 2018-04-08 – 2018-04-09 (×2): 500 mg via INTRAVENOUS
  Filled 2018-04-08 (×2): qty 500

## 2018-04-08 NOTE — Progress Notes (Signed)
[facility] PROGRESS NOTE    Progress Note  Megan Solis  ZOX:[id number] DOB: [birth year] DOA: 04/06/2018 PCP: Patient, No Pcp Per     Brief Narrative:   Megan Solis is an 42 y.o. female past medical history of tobacco abuse right breast abscess presents with community-acquired pneumonia and sepsis  Assessment/Plan:   Sepsis (HCC) 2 atypical  Pneumonia Blood cultures, sputum culture and urine antigens are pending. Discontinue Levaquin and start her on Rocephin and azithromycin. Restart IV fluid with potassium supplementation. No wheezing on physical exam discontinue steroids.  Abdominal pain: Likely due to cough as the pain is along the rib cage, CT scan of the abdomen and pelvis showed no acute findings.  Alcohol abuse/alcohol withdrawal (HCC) Last drink was 1 days prior to admission, will continue to monitor.  Leukocytosis: Pneumonia.  Normocytic anemia: Elevated RDW and a borderline high MCV, concern about a mixed picture or B12 or folate deficiency. We will check an anemia panel.  Hypokalemia: Replete.  History of seizure None  Tobacco abuse coundeling   DVT prophylaxis: lovenox Family Communication:none Disposition Plan/Barrier to D/C: transfer to tele Code Status:     Code Status Orders  (From admission, onward)        Start     Ordered   04/07/18 0738  Full code  Continuous     04/07/18 0739    Code Status History    This patient has a current code status but no historical code status.        IV Access:    Peripheral IV   Procedures and diagnostic studies:   Dg Chest 2 View  Result Date: 04/06/2018 CLINICAL DATA:  Cough, shortness of breath, low-grade fever EXAM: CHEST - 2 VIEW COMPARISON:  None. FINDINGS: Mild patchy opacities in the bilateral lower lungs, likely in the lingula and right middle lobe, worrisome for mild infection. No pleural effusion or pneumothorax. The heart is normal in size. Visualized osseous  structures are within normal limits. IMPRESSION: Mild patchy opacities in the bilateral lower lungs, likely in the lingula and right middle lobe, worrisome for mild infection. Electronically Signed   By: Charline Bills M.D.   On: 04/06/2018 21:59   Ct Angio Chest Pe W Or Wo Contrast  Result Date: 04/07/2018 CLINICAL DATA:  42 y/o F; abdominal distention, fever, cough, shortness of breath. EXAM: CT ANGIOGRAPHY CHEST CT ABDOMEN AND PELVIS WITH CONTRAST TECHNIQUE: Multidetector CT imaging of the chest was performed using the standard protocol during bolus administration of intravenous contrast. Multiplanar CT image reconstructions and MIPs were obtained to evaluate the vascular anatomy. Multidetector CT imaging of the abdomen and pelvis was performed using the standard protocol during bolus administration of intravenous contrast. CONTRAST:  ISOVUE-370 IOPAMIDOL (ISOVUE-370) INJECTION 76% COMPARISON:  04/06/2018 chest radiograph FINDINGS: CTA CHEST FINDINGS Cardiovascular: Satisfactory opacification of the pulmonary arteries to the segmental level. No evidence of pulmonary embolism. Normal heart size. No pericardial effusion. Mediastinum/Nodes: No enlarged mediastinal, hilar, or axillary lymph nodes. Thyroid gland, trachea, and esophagus demonstrate no significant findings. Lungs/Pleura: Clustered nodules and patchy ground-glass opacities in bronchovascular distribution peribronchial thickening and scattered mucous plugging. No pleural effusion or pneumothorax. Musculoskeletal: No chest wall abnormality. No acute or significant osseous findings. Review of the MIP images confirms the above findings. CT ABDOMEN and PELVIS FINDINGS Hepatobiliary: No focal liver abnormality is seen. No gallstones, gallbladder wall thickening, or biliary dilatation. Pancreas: Unremarkable. No pancreatic ductal dilatation or surrounding inflammatory changes. Spleen: Normal in size without  focal abnormality. Adrenals/Urinary  Tract: Adrenal glands are unremarkable. Kidneys are normal, without renal calculi, focal lesion, or hydronephrosis. Bladder is unremarkable. Stomach/Bowel: Stomach is within normal limits. Appendix appears normal. No evidence of bowel wall thickening, distention, or inflammatory changes. Vascular/Lymphatic: Aortic atherosclerosis. No enlarged abdominal or pelvic lymph nodes. Reproductive: Uterus and bilateral adnexa are unremarkable. Other: No abdominal wall hernia or abnormality. No abdominopelvic ascites. Musculoskeletal: No fracture is seen. Review of the MIP images confirms the above findings. IMPRESSION: 1. Clustered nodules and ground-glass opacities in bronchovascular distribution throughout the lungs compatible with acute bronchopneumonia. 2. No pulmonary embolus identified. 3. No acute process of abdomen or pelvis identified. Electronically Signed   By: Mitzi Hansen M.D.   On: 04/07/2018 15:14   Ct Abdomen Pelvis W Contrast  Result Date: 04/07/2018 CLINICAL DATA:  42 y/o F; abdominal distention, fever, cough, shortness of breath. EXAM: CT ANGIOGRAPHY CHEST CT ABDOMEN AND PELVIS WITH CONTRAST TECHNIQUE: Multidetector CT imaging of the chest was performed using the standard protocol during bolus administration of intravenous contrast. Multiplanar CT image reconstructions and MIPs were obtained to evaluate the vascular anatomy. Multidetector CT imaging of the abdomen and pelvis was performed using the standard protocol during bolus administration of intravenous contrast. CONTRAST:  ISOVUE-370 IOPAMIDOL (ISOVUE-370) INJECTION 76% COMPARISON:  04/06/2018 chest radiograph FINDINGS: CTA CHEST FINDINGS Cardiovascular: Satisfactory opacification of the pulmonary arteries to the segmental level. No evidence of pulmonary embolism. Normal heart size. No pericardial effusion. Mediastinum/Nodes: No enlarged mediastinal, hilar, or axillary lymph nodes. Thyroid gland, trachea, and esophagus  demonstrate no significant findings. Lungs/Pleura: Clustered nodules and patchy ground-glass opacities in bronchovascular distribution peribronchial thickening and scattered mucous plugging. No pleural effusion or pneumothorax. Musculoskeletal: No chest wall abnormality. No acute or significant osseous findings. Review of the MIP images confirms the above findings. CT ABDOMEN and PELVIS FINDINGS Hepatobiliary: No focal liver abnormality is seen. No gallstones, gallbladder wall thickening, or biliary dilatation. Pancreas: Unremarkable. No pancreatic ductal dilatation or surrounding inflammatory changes. Spleen: Normal in size without focal abnormality. Adrenals/Urinary Tract: Adrenal glands are unremarkable. Kidneys are normal, without renal calculi, focal lesion, or hydronephrosis. Bladder is unremarkable. Stomach/Bowel: Stomach is within normal limits. Appendix appears normal. No evidence of bowel wall thickening, distention, or inflammatory changes. Vascular/Lymphatic: Aortic atherosclerosis. No enlarged abdominal or pelvic lymph nodes. Reproductive: Uterus and bilateral adnexa are unremarkable. Other: No abdominal wall hernia or abnormality. No abdominopelvic ascites. Musculoskeletal: No fracture is seen. Review of the MIP images confirms the above findings. IMPRESSION: 1. Clustered nodules and ground-glass opacities in bronchovascular distribution throughout the lungs compatible with acute bronchopneumonia. 2. No pulmonary embolus identified. 3. No acute process of abdomen or pelvis identified. Electronically Signed   By: Mitzi Hansen M.D.   On: 04/07/2018 15:14     Medical Consultants:    None.  Anti-Infectives:   IV Rocephin and Cipro.  Subjective:    Megan Solis she relates she feels much better compared to yesterday.  Objective:    Vitals:   04/07/18 1948 04/07/18 2003 04/07/18 2348 04/08/18 0345  BP:  110/78    Pulse:      Resp:  (!) 24    Temp: 98.2 F (36.8 C)   98.3 F (36.8 C) 98.1 F (36.7 C)  TempSrc: Oral  Oral Oral  SpO2:  97%    Weight:      Height:        Intake/Output Summary (Last 24 hours) at 04/08/2018 0750 Last data filed at 04/08/2018 0600  Gross per 24 hour  Intake 3267.5 ml  Output 550 ml  Net 2717.5 ml   Filed Weights   04/06/18 2118 04/07/18 0342  Weight: 72.6 kg (160 lb) 71.3 kg (157 lb 3 oz)    Exam: General exam: In no acute distress. Respiratory system: Good air movement clear to auscultation Cardiovascular system: Tachycardia with a regular rate and rhythm with positive S1-S2 Gastrointestinal system: Abdomen is soft, positive bowel sounds mildly tender rib cage. Central nervous system: Alert and oriented. No focal neurological deficits. Extremities: No pedal edema. Skin: No rashes, lesions or ulcers    Data Reviewed:    Labs: Basic Metabolic Panel: Recent Labs  Lab 04/06/18 2221 04/07/18 0848 04/08/18 0343  NA 135 140 140  K 3.1* 3.3* 3.4*  CL 100* 106 106  CO2 21* 21* 24  GLUCOSE 126* 153* 115*  BUN <5* <5* <5*  CREATININE 0.62 0.57 0.53  CALCIUM 8.8* 8.7* 8.8*  MG  --  1.3* 1.8   GFR Estimated Creatinine Clearance: 92.5 mL/min (by C-G formula based on SCr of 0.53 mg/dL). Liver Function Tests: Recent Labs  Lab 04/06/18 2221 04/07/18 0848 04/08/18 0343  AST 24 23 17   ALT 11* 9* 10*  ALKPHOS 116 110 113  BILITOT 1.0 1.0 0.9  PROT 7.8 6.8 6.5  ALBUMIN 3.4* 2.8* 2.5*   Recent Labs  Lab 04/07/18 0848  LIPASE 30   No results for input(s): AMMONIA in the last 168 hours. Coagulation profile No results for input(s): INR, PROTIME in the last 168 hours.  CBC: Recent Labs  Lab 04/06/18 2221 04/07/18 0848 04/08/18 0343  WBC 21.5* 19.4* 15.3*  NEUTROABS 17.2* 16.1*  --   HGB 11.5* 10.2* 10.2*  HCT 34.4* 32.2* 32.0*  MCV 90.8 92.5 91.7  PLT 339 319 347   Cardiac Enzymes: Recent Labs  Lab 04/07/18 0848 04/08/18 0343  TROPONINI <0.03 <0.03   BNP (last 3 results) No results  for input(s): PROBNP in the last 8760 hours. CBG: No results for input(s): GLUCAP in the last 168 hours. D-Dimer: No results for input(s): DDIMER in the last 72 hours. Hgb A1c: No results for input(s): HGBA1C in the last 72 hours. Lipid Profile: No results for input(s): CHOL, HDL, LDLCALC, TRIG, CHOLHDL, LDLDIRECT in the last 72 hours. Thyroid function studies: No results for input(s): TSH, T4TOTAL, T3FREE, THYROIDAB in the last 72 hours.  Invalid input(s): FREET3 Anemia work up: No results for input(s): VITAMINB12, FOLATE, FERRITIN, TIBC, IRON, RETICCTPCT in the last 72 hours. Sepsis Labs: Recent Labs  Lab 04/06/18 2221  04/07/18 0445 04/07/18 0848 04/07/18 1037 04/07/18 1550 04/08/18 0343  WBC 21.5*  --   --  19.4*  --   --  15.3*  LATICACIDVEN  --    < > 5.0* 4.5* 3.0* 1.7  --    < > = values in this interval not displayed.   Microbiology Recent Results (from the past 240 hour(s))  MRSA PCR Screening     Status: None   Collection Time: 04/07/18  3:53 AM  Result Value Ref Range Status   MRSA by PCR NEGATIVE NEGATIVE Final    Comment:        The GeneXpert MRSA Assay (FDA approved for NASAL specimens only), is one component of a comprehensive MRSA colonization surveillance program. It is not intended to diagnose MRSA infection nor to guide or monitor treatment for MRSA infections. Performed at [facility], [address] [place], [place] [id number]   Culture,  sputum-assessment     Status: None   Collection Time: 04/07/18  7:16 AM  Result Value Ref Range Status   Specimen Description SPUTUM  Final   Special Requests SPUTUM  Final   Sputum evaluation   Final    Sputum specimen not acceptable for testing.  Please recollect.   Tally Joe [id number] @ 2120 BY Knute Neu Performed at [facility], [address] [place], [place] [address]    Report Status 04/07/2018 FINAL  Final     Medications:   .  chlordiazePOXIDE  25 mg Oral QID   Followed by  . chlordiazePOXIDE  25 mg Oral TID   Followed by  . [START ON 04/09/2018] chlordiazePOXIDE  25 mg Oral BH-qamhs   Followed by  . [START ON 04/10/2018] chlordiazePOXIDE  25 mg Oral Daily  . enoxaparin (LOVENOX) injection  40 mg Subcutaneous Q24H  . folic acid  1 mg Oral Daily  . multivitamin with minerals  1 tablet Oral Daily  . nicotine  21 mg Transdermal Daily  . predniSONE  40 mg Oral Q breakfast  . thiamine  100 mg Oral Daily   Or  . thiamine  100 mg Intravenous Daily   Continuous Infusions: . lactated ringers 125 mL/hr (04/08/18 0425)  . levofloxacin (LEVAQUIN) IV Stopped (04/07/18 2307)     LOS: 1 day   Marinda Elk  [facility] Pager [phone]  *Please refer to [email or web address], password [email or web address] to get updated schedule on who will round on this patient, as hospitalists switch teams weekly. If 7PM-7AM, please contact night-coverage at [email or web address], password [email or web address] for any overnight needs.  04/08/2018, 7:50 AM

## 2018-04-09 DIAGNOSIS — A419 Sepsis, unspecified organism: Principal | ICD-10-CM

## 2018-04-09 DIAGNOSIS — Z720 Tobacco use: Secondary | ICD-10-CM

## 2018-04-09 DIAGNOSIS — Z87898 Personal history of other specified conditions: Secondary | ICD-10-CM

## 2018-04-09 DIAGNOSIS — F10230 Alcohol dependence with withdrawal, uncomplicated: Secondary | ICD-10-CM

## 2018-04-09 DIAGNOSIS — J189 Pneumonia, unspecified organism: Secondary | ICD-10-CM

## 2018-04-09 MED ORDER — AZITHROMYCIN 250 MG PO TABS
500.0000 mg | ORAL_TABLET | Freq: Every day | ORAL | Status: DC
  Administered 2018-04-10: 500 mg via ORAL
  Filled 2018-04-09: qty 2

## 2018-04-09 NOTE — Progress Notes (Addendum)
PROGRESS NOTE  Megan CoralDorothy E Onnen EAV:[id number] DOB: [birth year] DOA: 04/06/2018 PCP: Patient, No Pcp Per  HPI/Recap of past 24 hours: Megan Solis is an 42 y.o. female with past medical history of tobacco & alcohol abuse, right breast abscess presents with SOB and intermittent cough found to have sepsis 2/2 community-acquired pneumonia.  Today, patient reported feeling slightly better, still having cough, fatigue.  Patient denies any fever/chills, chest pain, shortness of breath, abdominal pain, nausea/vomiting.  Assessment/Plan: Principal Problem:   Sepsis (HCC) Active Problems:   Pneumonia   Alcohol withdrawal (HCC)   History of seizure   Tobacco abuse   Atypical pneumonia   Sepsis 2/2 CAP Improving Currently afebrile, with resolving leukocytosis Tachycardic, leukocytosis, fever, lactic acidosis on presentation CT chest: Clustered nodules and ground-glass opacities in bronchovascular distribution throughout the lungs compatible with acute bronchopneumonia. No pulmonary embolus identified Blood cultures NGTD Sputum culture pending Urine strep pneumo, Legionella both negative Continue Rocephin and azithromycin  Abdominal pain Resolved Likely due to cough as the pain is along the rib cage, CT scan of the abdomen and pelvis showed no acute findings.  Alcohol abuse/alcohol withdrawal Last drink was 1 days prior to admission Continue tapered dose of Librium Ativan as needed Continue folic acid, MVT, thiamine  Normocytic anemia Iron panel, folate WNL, Vit B12: 297 Monitor   Hypokalemia Replace prn  History of seizure None  Tobacco abuse Counseled Continue nicotine patch    Code Status: Full  Family Communication: None at bedside  Disposition Plan: Home once stable   Consultants:  None  Procedures:  None  Antimicrobials:  Rocephin  Azithromycin  DVT prophylaxis: Lovenox   Objective: Vitals:   04/08/18 1203 04/08/18 2137  04/09/18 0528 04/09/18 1047  BP: 101/80 (!) 153/98 (!) 126/94   Pulse: (!) 111 88 82   Resp:  18 14   Temp:  98 F (36.7 C) 98.1 F (36.7 C)   TempSrc:  Oral Oral   SpO2: 100% 99% 97% 96%  Weight:      Height:        Intake/Output Summary (Last 24 hours) at 04/09/2018 1418 Last data filed at 04/09/2018 0900 Gross per 24 hour  Intake 360 ml  Output -  Net 360 ml   Filed Weights   04/06/18 2118 04/07/18 0342  Weight: 72.6 kg (160 lb) 71.3 kg (157 lb 3 oz)    Exam:   General: NAD   Cardiovascular: S1, S2 present  Respiratory: Coarse breath sound bilaterally  Abdomen: Soft, nontender, nondistended, bowel sounds present  Musculoskeletal: No pedal edema bilaterally  Skin: Normal  Psychiatry: Normal mood   Data Reviewed: CBC: Recent Labs  Lab 04/06/18 2221 04/07/18 0848 04/08/18 0343  WBC 21.5* 19.4* 15.3*  NEUTROABS 17.2* 16.1*  --   HGB 11.5* 10.2* 10.2*  HCT 34.4* 32.2* 32.0*  MCV 90.8 92.5 91.7  PLT 339 319 347   Basic Metabolic Panel: Recent Labs  Lab 04/06/18 2221 04/07/18 0848 04/08/18 0343  NA 135 140 140  K 3.1* 3.3* 3.4*  CL 100* 106 106  CO2 21* 21* 24  GLUCOSE 126* 153* 115*  BUN <5* <5* <5*  CREATININE 0.62 0.57 0.53  CALCIUM 8.8* 8.7* 8.8*  MG  --  1.3* 1.8   GFR: Estimated Creatinine Clearance: 92.5 mL/min (by C-G formula based on SCr of 0.53 mg/dL). Liver Function Tests: Recent Labs  Lab 04/06/18 2221 04/07/18 0848 04/08/18 0343  AST 24 23 17   ALT 11* 9* 10*  ALKPHOS 116 110 113  BILITOT 1.0 1.0 0.9  PROT 7.8 6.8 6.5  ALBUMIN 3.4* 2.8* 2.5*   Recent Labs  Lab 04/07/18 0848  LIPASE 30   No results for input(s): AMMONIA in the last 168 hours. Coagulation Profile: No results for input(s): INR, PROTIME in the last 168 hours. Cardiac Enzymes: Recent Labs  Lab 04/07/18 0848 04/08/18 0343  TROPONINI <0.03 <0.03   BNP (last 3 results) No results for input(s): PROBNP in the last 8760 hours. HbA1C: No results for  input(s): HGBA1C in the last 72 hours. CBG: No results for input(s): GLUCAP in the last 168 hours. Lipid Profile: No results for input(s): CHOL, HDL, LDLCALC, TRIG, CHOLHDL, LDLDIRECT in the last 72 hours. Thyroid Function Tests: No results for input(s): TSH, T4TOTAL, FREET4, T3FREE, THYROIDAB in the last 72 hours. Anemia Panel: Recent Labs    04/08/18 0343 04/08/18 1031  VITAMINB12  --  297  FOLATE  --  21.9  FERRITIN  --  80  TIBC  --  272  IRON  --  29  RETICCTPCT 2.5  --    Urine analysis: No results found for: COLORURINE, APPEARANCEUR, LABSPEC, PHURINE, GLUCOSEU, HGBUR, BILIRUBINUR, KETONESUR, PROTEINUR, UROBILINOGEN, NITRITE, LEUKOCYTESUR Sepsis Labs: @LABRCNTIP (procalcitonin:4,lacticidven:4)  ) Recent Results (from the past 240 hour(s))  MRSA PCR Screening     Status: None   Collection Time: 04/07/18  3:53 AM  Result Value Ref Range Status   MRSA by PCR NEGATIVE NEGATIVE Final    Comment:        The GeneXpert MRSA Assay (FDA approved for NASAL specimens only), is one component of a comprehensive MRSA colonization surveillance program. It is not intended to diagnose MRSA infection nor to guide or monitor treatment for MRSA infections. Performed at [facility], [address], [place], [place] [address]   Culture, sputum-assessment     Status: None   Collection Time: 04/07/18  7:16 AM  Result Value Ref Range Status   Specimen Description SPUTUM  Final   Special Requests SPUTUM  Final   Sputum evaluation   Final    Sputum specimen not acceptable for testing.  Please recollect.   Tally Joe [id number] @ 2120 BY Knute Neu Performed at [facility], [address] [place], [place] [address]    Report Status 04/07/2018 FINAL  Final  Culture, blood (routine x 2)     Status: None (Preliminary result)   Collection Time: 04/07/18 10:22 AM  Result Value Ref Range Status   Specimen Description   Final    BLOOD RIGHT  ANTECUBITAL Performed at [facility], [address], [place], [place] [address]    Special Requests   Final    BOTTLES DRAWN AEROBIC AND ANAEROBIC Blood Culture adequate volume Performed at [facility], [address], [place], [place] [address]    Culture   Final    NO GROWTH 1 DAY Performed at [facility], [address] [place], [place] [address]    Report Status PENDING  Incomplete  Culture, blood (routine x 2)     Status: None (Preliminary result)   Collection Time: 04/07/18 10:36 AM  Result Value Ref Range Status   Specimen Description   Final    BLOOD LEFT HAND Performed at [facility], [address] [place], [place] [address]    Special Requests   Final    BOTTLES DRAWN AEROBIC ONLY Blood Culture results may not be  optimal due to an inadequate volume of blood received in culture bottles Performed at [facility], [address] [place], [place] [address]    Culture   Final    NO GROWTH 1 DAY Performed at [facility], [address] [place], [place] [address]    Report Status PENDING  Incomplete  Culture, expectorated sputum-assessment     Status: None   Collection Time: 04/08/18  3:30 PM  Result Value Ref Range Status   Specimen Description SPUTUM  Final   Special Requests NONE  Final   Sputum evaluation   Final    THIS SPECIMEN IS ACCEPTABLE FOR SPUTUM CULTURE Performed at [facility], [address], [place], [place] [address]    Report Status 04/08/2018 FINAL  Final      Studies: No results found.  Scheduled Meds: . [START ON 04/10/2018] azithromycin  500 mg Oral Daily  . chlordiazePOXIDE  25 mg Oral BH-qamhs   Followed by  . [START ON 04/10/2018] chlordiazePOXIDE  25 mg Oral Daily  . enoxaparin (LOVENOX) injection  40 mg Subcutaneous Q24H  . folic acid  1 mg Oral Daily  . multivitamin with minerals  1 tablet Oral Daily  . nicotine  21 mg  Transdermal Daily  . thiamine  100 mg Oral Daily   Or  . thiamine  100 mg Intravenous Daily    Continuous Infusions: . 0.9 % NaCl with KCl 40 mEq / L 75 mL/hr (04/08/18 2246)  . cefTRIAXone (ROCEPHIN)  IV Stopped (04/09/18 0819)  . lactated ringers 125 mL/hr (04/08/18 0425)     LOS: 2 days     Briant Cedar, MD [facility]  If 7PM-7AM, please contact night-coverage [email or web address] Password [email or web address] 04/09/2018, 2:18 PM

## 2018-04-10 LAB — CBC WITH DIFFERENTIAL/PLATELET
BASOS PCT: 0 %
Basophils Absolute: 0 10*3/uL (ref 0.0–0.1)
EOS ABS: 0.3 10*3/uL (ref 0.0–0.7)
Eosinophils Relative: 4 %
HCT: 33.9 % — ABNORMAL LOW (ref 36.0–46.0)
Hemoglobin: 10.7 g/dL — ABNORMAL LOW (ref 12.0–15.0)
Lymphocytes Relative: 26 %
Lymphs Abs: 2 10*3/uL (ref 0.7–4.0)
MCH: 28.7 pg (ref 26.0–34.0)
MCHC: 31.6 g/dL (ref 30.0–36.0)
MCV: 90.9 fL (ref 78.0–100.0)
MONO ABS: 0.8 10*3/uL (ref 0.1–1.0)
Monocytes Relative: 10 %
NEUTROS ABS: 4.7 10*3/uL (ref 1.7–7.7)
Neutrophils Relative %: 60 %
PLATELETS: 371 10*3/uL (ref 150–400)
RBC: 3.73 MIL/uL — ABNORMAL LOW (ref 3.87–5.11)
RDW: 22.1 % — AB (ref 11.5–15.5)
WBC: 7.8 10*3/uL (ref 4.0–10.5)

## 2018-04-10 LAB — BASIC METABOLIC PANEL
Anion gap: 11 (ref 5–15)
BUN: 7 mg/dL (ref 6–20)
CO2: 24 mmol/L (ref 22–32)
CREATININE: 0.56 mg/dL (ref 0.44–1.00)
Calcium: 9.1 mg/dL (ref 8.9–10.3)
Chloride: 103 mmol/L (ref 101–111)
GFR calc Af Amer: >60 mL/min (ref >60)
Glucose, Bld: 119 mg/dL — ABNORMAL HIGH (ref 65–99)
Potassium: 4.3 mmol/L (ref 3.5–5.1)
Sodium: 138 mmol/L (ref 135–145)

## 2018-04-10 MED ORDER — AMOXICILLIN-POT CLAVULANATE 875-125 MG PO TABS: 1.0000 | ORAL_TABLET | Freq: Two times a day (BID) | ORAL | 0 refills | Status: AC

## 2018-04-10 MED ORDER — NICOTINE 21 MG/24HR TD PT24: 21.0000 mg | MEDICATED_PATCH | Freq: Every day | TRANSDERMAL | 0 refills | Status: DC

## 2018-04-10 MED ORDER — THIAMINE HCL 100 MG PO TABS: 100.0000 mg | ORAL_TABLET | Freq: Every day | ORAL | 0 refills | Status: DC

## 2018-04-10 NOTE — Discharge Summary (Signed)
Discharge Summary  Megan Solis ZOX:[id number] DOB: July 06, 1976  PCP: Patient, No Pcp Per  Admit date: 04/06/2018 Discharge date: 04/10/2018  Time spent: 35 mins  Recommendations for Outpatient Follow-up:  1. Care manager notified to assist pt in setting [facility]  Discharge Diagnoses:  Active Hospital Problems   Diagnosis Date Noted  . Sepsis (HCC) 04/07/2018  . Atypical pneumonia 04/08/2018  . Pneumonia 04/07/2018  . Alcohol withdrawal (HCC) 04/07/2018  . History of seizure 04/07/2018  . Tobacco abuse 04/07/2018    Resolved Hospital Problems  No resolved problems to display.    Discharge Condition: Stable  Diet recommendation: Heart healthy  Vitals:   04/09/18 2022 04/10/18 0452  BP: 137/87 (!) 150/60  Pulse: 80 81  Resp: 18 18  Temp: 98.5 F (36.9 C) 98.5 F (36.9 C)  SpO2: 93% 100%    History of present illness:  Megan Solis an 42 y.o.femalewith past medical history of tobacco & alcohol abuse, right breast abscess presents with SOB and intermittent cough found to have sepsis 2/2 community-acquired pneumonia.  Today, patient reported feeling much better and insisting she has to go today, if not she will be out of work. Patient denies any fever/chills, chest pain, shortness of breath, abdominal pain, nausea/vomiting. Pt has been advised to set up a PCP at the [facility] as pt has no insurance and no money. CM provided pt with assistance to buy her medications.  Hospital Course:  Principal Problem:   Sepsis (HCC) Active Problems:   Pneumonia   Alcohol withdrawal (HCC)   History of seizure   Tobacco abuse   Atypical pneumonia  Sepsis 2/2 CAP Improved Currently afebrile, with resolved leukocytosis Tachycardic, leukocytosis, fever, lactic acidosis on presentation CT chest: Clustered nodules and ground-glass opacities in bronchovascular distribution throughout the lungs compatible with  acute bronchopneumonia. No pulmonary embolus identified Blood cultures NGTD, sputum culture pending Urine strep pneumo, Legionella, influenza all negative Received Rocephin and azithromycin for 3 days, will discharge pt on PO Augmentin for 4 days for a total of 7 days of AB Pt encouraged to set up aapt with PCP  Abdominal pain Resolved Likely due to cough as the painisalong the rib cage, CT scan of the abdomen and pelvis showed no acute findings  Alcohol abuse/alcohol withdrawal Last drink was 1 days prior to admission Received tapered dose of Librium SW provided pt with resources to help pt quit, pt refused. Pt states she is familiar with AA and her roommates will help her quit Provided script for PO thiamine, states will fill later  Normocytic anemia Iron panel, folate WNL, Vit B12: 297 Follow up with PCP   Hypokalemia Replace prn  History of seizure None  Tobacco abuse Counseled Provided script for nicotine patch, pt reported that she is not interested in quitting     Procedures:  None  Consultations:  None  Discharge Exam: BP (!) 150/60 (BP Location: Right Arm)   Pulse 81   Temp 98.5 F (36.9 C) (Oral)   Resp 18   Ht 5\' 5"  (1.651 m)   Wt 72.2 kg (159 lb 2.8 oz)   LMP 04/04/2018   SpO2 100%   BMI 26.49 kg/m   General: AAO X 3, NAD  Cardiovascular: S1, S2 present Respiratory: Mild bilateral expiratory wheezing noted  Discharge Instructions You were cared for by a hospitalist during your hospital stay. If you have any questions about your discharge medications or the care you  received while you were in the hospital after you are discharged, you can call the unit and asked to speak with the hospitalist on call if the hospitalist that took care of you is not available. Once you are discharged, your primary care physician will handle any further medical issues. Please note that NO REFILLS for any discharge medications will be authorized once you are  discharged, as it is imperative that you return to your primary care physician (or establish a relationship with a primary care physician if you do not have one) for your aftercare needs so that they can reassess your need for medications and monitor your lab values.  Discharge Instructions    Diet - low sodium heart healthy   Complete by:  As directed    Increase activity slowly   Complete by:  As directed      Allergies as of 04/10/2018   No Known Allergies     Medication List    TAKE these medications   amoxicillin-clavulanate 875-125 MG tablet Commonly known as:  AUGMENTIN Take 1 tablet by mouth 2 (two) times daily for 4 days.   nicotine 21 mg/24hr patch Commonly known as:  NICODERM CQ - dosed in mg/24 hours Place 1 patch (21 mg total) onto the skin daily. Start taking on:  04/11/2018   thiamine 100 MG tablet Take 1 tablet (100 mg total) by mouth daily. Start taking on:  04/11/2018      No Known Allergies    The results of significant diagnostics from this hospitalization (including imaging, microbiology, ancillary and laboratory) are listed below for reference.    Significant Diagnostic Studies: Dg Chest 2 View  Result Date: 04/06/2018 CLINICAL DATA:  Cough, shortness of breath, low-grade fever EXAM: CHEST - 2 VIEW COMPARISON:  None. FINDINGS: Mild patchy opacities in the bilateral lower lungs, likely in the lingula and right middle lobe, worrisome for mild infection. No pleural effusion or pneumothorax. The heart is normal in size. Visualized osseous structures are within normal limits. IMPRESSION: Mild patchy opacities in the bilateral lower lungs, likely in the lingula and right middle lobe, worrisome for mild infection. Electronically Signed   By: Charline Bills M.D.   On: 04/06/2018 21:59   Ct Angio Chest Pe W Or Wo Contrast  Result Date: 04/07/2018 CLINICAL DATA:  42 y/o F; abdominal distention, fever, cough, shortness of breath. EXAM: CT ANGIOGRAPHY CHEST CT  ABDOMEN AND PELVIS WITH CONTRAST TECHNIQUE: Multidetector CT imaging of the chest was performed using the standard protocol during bolus administration of intravenous contrast. Multiplanar CT image reconstructions and MIPs were obtained to evaluate the vascular anatomy. Multidetector CT imaging of the abdomen and pelvis was performed using the standard protocol during bolus administration of intravenous contrast. CONTRAST:  ISOVUE-370 IOPAMIDOL (ISOVUE-370) INJECTION 76% COMPARISON:  04/06/2018 chest radiograph FINDINGS: CTA CHEST FINDINGS Cardiovascular: Satisfactory opacification of the pulmonary arteries to the segmental level. No evidence of pulmonary embolism. Normal heart size. No pericardial effusion. Mediastinum/Nodes: No enlarged mediastinal, hilar, or axillary lymph nodes. Thyroid gland, trachea, and esophagus demonstrate no significant findings. Lungs/Pleura: Clustered nodules and patchy ground-glass opacities in bronchovascular distribution peribronchial thickening and scattered mucous plugging. No pleural effusion or pneumothorax. Musculoskeletal: No chest wall abnormality. No acute or significant osseous findings. Review of the MIP images confirms the above findings. CT ABDOMEN and PELVIS FINDINGS Hepatobiliary: No focal liver abnormality is seen. No gallstones, gallbladder wall thickening, or biliary dilatation. Pancreas: Unremarkable. No pancreatic ductal dilatation or surrounding inflammatory changes. Spleen: Normal  in size without focal abnormality. Adrenals/Urinary Tract: Adrenal glands are unremarkable. Kidneys are normal, without renal calculi, focal lesion, or hydronephrosis. Bladder is unremarkable. Stomach/Bowel: Stomach is within normal limits. Appendix appears normal. No evidence of bowel wall thickening, distention, or inflammatory changes. Vascular/Lymphatic: Aortic atherosclerosis. No enlarged abdominal or pelvic lymph nodes. Reproductive: Uterus and bilateral adnexa are  unremarkable. Other: No abdominal wall hernia or abnormality. No abdominopelvic ascites. Musculoskeletal: No fracture is seen. Review of the MIP images confirms the above findings. IMPRESSION: 1. Clustered nodules and ground-glass opacities in bronchovascular distribution throughout the lungs compatible with acute bronchopneumonia. 2. No pulmonary embolus identified. 3. No acute process of abdomen or pelvis identified. Electronically Signed   By: Mitzi HansenLance  Furusawa-Stratton M.D.   On: 04/07/2018 15:14   Ct Abdomen Pelvis W Contrast  Result Date: 04/07/2018 CLINICAL DATA:  42 y/o F; abdominal distention, fever, cough, shortness of breath. EXAM: CT ANGIOGRAPHY CHEST CT ABDOMEN AND PELVIS WITH CONTRAST TECHNIQUE: Multidetector CT imaging of the chest was performed using the standard protocol during bolus administration of intravenous contrast. Multiplanar CT image reconstructions and MIPs were obtained to evaluate the vascular anatomy. Multidetector CT imaging of the abdomen and pelvis was performed using the standard protocol during bolus administration of intravenous contrast. CONTRAST:  100mL ISOVUE-370 IOPAMIDOL (ISOVUE-370) INJECTION 76% COMPARISON:  04/06/2018 chest radiograph FINDINGS: CTA CHEST FINDINGS Cardiovascular: Satisfactory opacification of the pulmonary arteries to the segmental level. No evidence of pulmonary embolism. Normal heart size. No pericardial effusion. Mediastinum/Nodes: No enlarged mediastinal, hilar, or axillary lymph nodes. Thyroid gland, trachea, and esophagus demonstrate no significant findings. Lungs/Pleura: Clustered nodules and patchy ground-glass opacities in bronchovascular distribution peribronchial thickening and scattered mucous plugging. No pleural effusion or pneumothorax. Musculoskeletal: No chest wall abnormality. No acute or significant osseous findings. Review of the MIP images confirms the above findings. CT ABDOMEN and PELVIS FINDINGS Hepatobiliary: No focal liver  abnormality is seen. No gallstones, gallbladder wall thickening, or biliary dilatation. Pancreas: Unremarkable. No pancreatic ductal dilatation or surrounding inflammatory changes. Spleen: Normal in size without focal abnormality. Adrenals/Urinary Tract: Adrenal glands are unremarkable. Kidneys are normal, without renal calculi, focal lesion, or hydronephrosis. Bladder is unremarkable. Stomach/Bowel: Stomach is within normal limits. Appendix appears normal. No evidence of bowel wall thickening, distention, or inflammatory changes. Vascular/Lymphatic: Aortic atherosclerosis. No enlarged abdominal or pelvic lymph nodes. Reproductive: Uterus and bilateral adnexa are unremarkable. Other: No abdominal wall hernia or abnormality. No abdominopelvic ascites. Musculoskeletal: No fracture is seen. Review of the MIP images confirms the above findings. IMPRESSION: 1. Clustered nodules and ground-glass opacities in bronchovascular distribution throughout the lungs compatible with acute bronchopneumonia. 2. No pulmonary embolus identified. 3. No acute process of abdomen or pelvis identified. Electronically Signed   By: Mitzi HansenLance  Furusawa-Stratton M.D.   On: 04/07/2018 15:14    Microbiology: Recent Results (from the past 240 hour(s))  MRSA PCR Screening     Status: None   Collection Time: 04/07/18  3:53 AM  Result Value Ref Range Status   MRSA by PCR NEGATIVE NEGATIVE Final    Comment:        The GeneXpert MRSA Assay (FDA approved for NASAL specimens only), is one component of a comprehensive MRSA colonization surveillance program. It is not intended to diagnose MRSA infection nor to guide or monitor treatment for MRSA infections. Performed at [facility], [address] [place], [place] [id number]   Culture, sputum-assessment     Status: None   Collection Time: 04/07/18  7:16 AM  Result  Value Ref Range Status   Specimen Description SPUTUM  Final   Special Requests SPUTUM  Final   Sputum  evaluation   Final    Sputum specimen not acceptable for testing.  Please recollect.   Tally Joe [id number] @ 2120 BY Knute Neu Performed at [facility], [address] [place], [place] [address]    Report Status 04/07/2018 FINAL  Final  Culture, blood (routine x 2)     Status: None (Preliminary result)   Collection Time: 04/07/18 10:22 AM  Result Value Ref Range Status   Specimen Description   Final    BLOOD RIGHT ANTECUBITAL Performed at [facility], [address], [place], [place] [address]    Special Requests   Final    BOTTLES DRAWN AEROBIC AND ANAEROBIC Blood Culture adequate volume Performed at [facility], [address], [place], [place] [address]    Culture   Final    NO GROWTH 2 DAYS Performed at [facility], [address] [place], [place] [address]    Report Status PENDING  Incomplete  Culture, blood (routine x 2)     Status: None (Preliminary result)   Collection Time: 04/07/18 10:36 AM  Result Value Ref Range Status   Specimen Description   Final    BLOOD LEFT HAND Performed at [facility], [address] [place], [place] [address]    Special Requests   Final    BOTTLES DRAWN AEROBIC ONLY Blood Culture results may not be optimal due to an inadequate volume of blood received in culture bottles Performed at [facility], [address], [place], [place] [address]    Culture   Final    NO GROWTH 2 DAYS Performed at [facility], [address] [place], [place] [address]    Report Status PENDING  Incomplete  Culture, expectorated sputum-assessment     Status: None   Collection Time: 04/08/18  3:30 PM  Result Value Ref Range Status   Specimen Description SPUTUM  Final   Special Requests NONE  Final   Sputum evaluation   Final    THIS SPECIMEN IS ACCEPTABLE FOR SPUTUM CULTURE Performed at [facility], [address] [place], [place] [address]    Report Status 04/08/2018 FINAL  Final  Culture, respiratory (NON-Expectorated)     Status: None (Preliminary result)   Collection Time: 04/08/18  3:30 PM  Result Value Ref Range Status   Specimen Description   Final    SPUTUM Performed at [facility], [address], [place], [place] [address]    Special Requests   Final    NONE Reflexed from [id number] Performed at [facility], [address], [place], [place] [address]    Culture   Final    CULTURE REINCUBATED FOR BETTER GROWTH Performed at [facility], [address] [place], [place] [address]    Report Status PENDING  Incomplete     Labs: Basic Metabolic Panel: Recent Labs  Lab 04/06/18 2221 04/07/18 0848 04/08/18 0343 04/10/18 0346  NA 135 140 140 138  K 3.1* 3.3* 3.4* 4.3  CL 100* 106 106 103  CO2 21* 21* 24 24  GLUCOSE 126* 153* 115* 119*  BUN <5* <5* <5* 7  CREATININE 0.62 0.57 0.53 0.56  CALCIUM 8.8* 8.7* 8.8* 9.1  MG  --  1.3* 1.8  --    Liver Function  Tests: Recent Labs  Lab 04/06/18 2221 04/07/18 0848 04/08/18 0343  AST 24 23 17   ALT 11* 9* 10*  ALKPHOS 116 110 113  BILITOT 1.0 1.0 0.9  PROT 7.8 6.8 6.5  ALBUMIN 3.4* 2.8* 2.5*   Recent Labs  Lab 04/07/18 0848  LIPASE 30   No results for input(s): AMMONIA in the last 168 hours. CBC: Recent Labs  Lab 04/06/18 2221 04/07/18 0848 04/08/18 0343 04/10/18 0346  WBC 21.5* 19.4* 15.3* 7.8  NEUTROABS 17.2* 16.1*  --  4.7  HGB 11.5* 10.2* 10.2* 10.7*  HCT 34.4* 32.2* 32.0* 33.9*  MCV 90.8 92.5 91.7 90.9  PLT 339 319 347 371   Cardiac Enzymes: Recent Labs  Lab 04/07/18 0848 04/08/18 0343  TROPONINI <0.03 <0.03   BNP: BNP (last 3 results) No results for input(s): BNP in the last 8760 hours.  ProBNP (last 3 results) No results for input(s): PROBNP in the last 8760 hours.  CBG: No results for input(s): GLUCAP in the last 168 hours.     Signed:  Briant Cedar, MD [facility] 04/10/2018, 11:33 AM

## 2018-04-10 NOTE — Progress Notes (Signed)
Patient discharged to home with family, discharge instructions reviewed with patient who verbalized understanding. New RX's given to patient. 

## 2018-04-10 NOTE — Progress Notes (Signed)
Spoke with patient at bedside. Discussed d/c meds, provided her with Goodrx app, she downloaded to her phone. She has some money and feels she can afford her antibiotics, she has no interest in stopping smoking and does not intend to fill nicotine patch. She will be able to get her thiamine next week. Provided her with information on the [facility], provided information on resources there, encouraged her to make a f/u appt and establish care with them. No further needs assessed. [phone]

## 2018-04-10 NOTE — Clinical Social Work Note (Signed)
Clinical Social Work Assessment  Patient Details  Name: Megan Solis MRN: [id number] Date of Birth: 05-22-1976  Date of referral:  04/10/18               Reason for consult:  Substance Use/ETOH Abuse                Permission sought to share information with:    Permission granted to share information::     Name::        Agency::     Relationship::     Contact Information:     Housing/Transportation Living arrangements for the past 2 months:  Apartment Source of Information:  Patient Patient Interpreter Needed:  None Criminal Activity/Legal Involvement Pertinent to Current Situation/Hospitalization:  No - Comment as needed Significant Relationships:  Friend(roommattes, coworkers) Lives with:  Roommate Do you feel safe going back to the place where you live?  Yes Need for family participation in patient care:  No (Coment)  Care giving concerns:  Pt lives with 3 roommates and works full time making deliveries. No caregiving concerns reported. Currently admitted for sepsis/pneumonia, and on CIWA protocol for etoh withdrawal   Social Worker assessment / plan:  CSW consulted to assess alcohol use issues.  Met with pt at bedside, she was alert, oriented, and engaging in conversation.  Pt reports that alcohol has been an issue for her for several years. Reports last year "she got to the point that she was drinking a gallon a day and I had to get help- I went to detox and treatment. Lately I've been drinking about 3-4 shots every night when I get home from work." Pt reported she realizes this "is still too much." CSW and pt processed her goals and barriers to achieving them. Pt identified goal of "cuttinng back to 2 shots 3 nights a week." States her roommates will be supportive of this as "they don't drink like me." Other goal is to "find one other thing I can do to cope with stress and do it for 5 minutes every night before I go to bed, and eventually get up to 20 min per night." Pt  states barriers are that she "works 14 hours a day and is so tired and stressed that I just want to drink and go to bed when I get home." Pt reports she is not interested in OP therapy options due to her work schedule but is familiar with AA resources.   Employment status:  [employer] information:  Self Pay (Medicaid Pending) PT Recommendations:  Not assessed at this time Information / Referral to community resources:     Patient/Family's Response to care:  Pt appreciative of care  Patient/Family's Understanding of and Emotional Response to Diagnosis, Current Treatment, and Prognosis:  Pt demonstrates good understanding of her treatment here. Was fairly forthcoming re: substance use issues and while not ready to pursue abstinence, she was open to making reasonable harm-reduction goals and reducing the stress that leads to her use. States, "I have made good strides with cutting back over the past year and I need to keep working on it"  Emotional Assessment Appearance:  Appears stated age Attitude/Demeanor/Rapport:  Engaged Affect (typically observed):  Accepting, Pleasant Orientation:  Oriented to Self, Oriented to Place, Oriented to  Time, Oriented to Situation Alcohol / Substance use:  Alcohol Use Psych involvement (Current and /or in the community):  No (Comment)  Discharge Needs  Concerns to be addressed:  Substance Abuse Concerns Readmission within  the last 30 days:  No Current discharge risk:  Substance Abuse Barriers to Discharge:  Continued Medical Work up   [employer], LCSW 04/10/2018, 11:24 AM [phone]

## 2018-04-11 LAB — CULTURE, RESPIRATORY: CULTURE: NORMAL

## 2018-04-11 LAB — CULTURE, RESPIRATORY W GRAM STAIN

## 2018-04-12 LAB — CULTURE, BLOOD (ROUTINE X 2)
CULTURE: NO GROWTH
CULTURE: NO GROWTH
Special Requests: ADEQUATE

## 2018-04-15 ENCOUNTER — Emergency Department (HOSPITAL_COMMUNITY)
Admission: EM | Admit: 2018-04-15 | Discharge: 2018-04-16 | Disposition: A | Discharge status: Other Acute Inpatient Hospital | Payer: No Typology Code available for payment source | Attending: Emergency Medicine | Admitting: Emergency Medicine

## 2018-04-15 ENCOUNTER — Other Ambulatory Visit: Payer: Self-pay

## 2018-04-15 ENCOUNTER — Encounter (HOSPITAL_COMMUNITY): Payer: Self-pay | Admitting: Nurse Practitioner

## 2018-04-15 DIAGNOSIS — F321 Major depressive disorder, single episode, moderate: Secondary | ICD-10-CM | POA: Diagnosis present

## 2018-04-15 DIAGNOSIS — T39312A Poisoning by propionic acid derivatives, intentional self-harm, initial encounter: Secondary | ICD-10-CM | POA: Insufficient documentation

## 2018-04-15 DIAGNOSIS — Z79899 Other long term (current) drug therapy: Secondary | ICD-10-CM | POA: Insufficient documentation

## 2018-04-15 DIAGNOSIS — F1010 Alcohol abuse, uncomplicated: Secondary | ICD-10-CM | POA: Insufficient documentation

## 2018-04-15 DIAGNOSIS — F332 Major depressive disorder, recurrent severe without psychotic features: Secondary | ICD-10-CM | POA: Insufficient documentation

## 2018-04-15 DIAGNOSIS — F17210 Nicotine dependence, cigarettes, uncomplicated: Secondary | ICD-10-CM | POA: Insufficient documentation

## 2018-04-15 LAB — I-STAT BETA HCG BLOOD, ED (MC, WL, AP ONLY): I-stat hCG, quantitative: <5 m[IU]/mL (ref <5)

## 2018-04-15 LAB — CBC WITH DIFFERENTIAL/PLATELET
BASOS PCT: 1 %
Basophils Absolute: 0.1 10*3/uL (ref 0.0–0.1)
EOS PCT: 6 %
Eosinophils Absolute: 0.3 10*3/uL (ref 0.0–0.7)
HEMATOCRIT: 36.4 % (ref 36.0–46.0)
Hemoglobin: 11.7 g/dL — ABNORMAL LOW (ref 12.0–15.0)
LYMPHS ABS: 2.9 10*3/uL (ref 0.7–4.0)
Lymphocytes Relative: 55 %
MCH: 29.3 pg (ref 26.0–34.0)
MCHC: 32.1 g/dL (ref 30.0–36.0)
MCV: 91 fL (ref 78.0–100.0)
MONO ABS: 0.6 10*3/uL (ref 0.1–1.0)
MONOS PCT: 11 %
NEUTROS ABS: 1.4 10*3/uL — AB (ref 1.7–7.7)
Neutrophils Relative %: 27 %
PLATELETS: 526 10*3/uL — AB (ref 150–400)
RBC: 4 MIL/uL (ref 3.87–5.11)
RDW: 21.1 % — AB (ref 11.5–15.5)
WBC: 5.3 10*3/uL (ref 4.0–10.5)

## 2018-04-15 LAB — COMPREHENSIVE METABOLIC PANEL
ALBUMIN: 3.6 g/dL (ref 3.5–5.0)
ALK PHOS: 72 U/L (ref 38–126)
ALT: 15 U/L (ref 14–54)
AST: 21 U/L (ref 15–41)
Anion gap: 13 (ref 5–15)
BUN: 9 mg/dL (ref 6–20)
CALCIUM: 8.6 mg/dL — AB (ref 8.9–10.3)
CO2: 23 mmol/L (ref 22–32)
CREATININE: 0.75 mg/dL (ref 0.44–1.00)
Chloride: 108 mmol/L (ref 101–111)
GFR calc non Af Amer: >60 mL/min (ref >60)
GLUCOSE: 76 mg/dL (ref 65–99)
Potassium: 3.8 mmol/L (ref 3.5–5.1)
SODIUM: 144 mmol/L (ref 135–145)
Total Bilirubin: 0.4 mg/dL (ref 0.3–1.2)
Total Protein: 7.5 g/dL (ref 6.5–8.1)

## 2018-04-15 LAB — RAPID URINE DRUG SCREEN, HOSP PERFORMED
Amphetamines: NOT DETECTED
BENZODIAZEPINES: POSITIVE — AB
Barbiturates: NOT DETECTED
Cocaine: NOT DETECTED
Opiates: NOT DETECTED
Tetrahydrocannabinol: POSITIVE — AB

## 2018-04-15 LAB — ACETAMINOPHEN LEVEL
Acetaminophen (Tylenol), Serum: <10 ug/mL — ABNORMAL LOW (ref 10–30)
Acetaminophen (Tylenol), Serum: <10 ug/mL — ABNORMAL LOW (ref 10–30)

## 2018-04-15 LAB — SALICYLATE LEVEL: Salicylate Lvl: <7 mg/dL (ref 2.8–30.0)

## 2018-04-15 LAB — ETHANOL: Alcohol, Ethyl (B): 269 mg/dL — ABNORMAL HIGH (ref <10)

## 2018-04-15 MED ORDER — ALUM & MAG HYDROXIDE-SIMETH 200-200-20 MG/5ML PO SUSP: 30.0000 mL | Freq: Four times a day (QID) | ORAL | Status: DC | PRN

## 2018-04-15 MED ORDER — LORAZEPAM 2 MG/ML IJ SOLN: 0.0000 mg | Freq: Two times a day (BID) | INTRAMUSCULAR | Status: DC

## 2018-04-15 MED ORDER — LORAZEPAM 1 MG PO TABS: 0.0000 mg | ORAL_TABLET | Freq: Two times a day (BID) | ORAL | Status: DC

## 2018-04-15 MED ORDER — NICOTINE 21 MG/24HR TD PT24
21.0000 mg | MEDICATED_PATCH | Freq: Every day | TRANSDERMAL | Status: DC
  Administered 2018-04-16: 21 mg via TRANSDERMAL
  Filled 2018-04-15: qty 1

## 2018-04-15 MED ORDER — VITAMIN B-1 100 MG PO TABS
100.0000 mg | ORAL_TABLET | Freq: Every day | ORAL | Status: DC
  Administered 2018-04-16: 100 mg via ORAL
  Filled 2018-04-15: qty 1

## 2018-04-15 MED ORDER — LORAZEPAM 2 MG/ML IJ SOLN
0.0000 mg | Freq: Four times a day (QID) | INTRAMUSCULAR | Status: DC
  Administered 2018-04-15: 2 mg via INTRAVENOUS
  Filled 2018-04-15: qty 1

## 2018-04-15 MED ORDER — LORAZEPAM 1 MG PO TABS
0.0000 mg | ORAL_TABLET | Freq: Four times a day (QID) | ORAL | Status: DC
  Administered 2018-04-16 (×2): 1 mg via ORAL
  Filled 2018-04-15 (×2): qty 1

## 2018-04-15 MED ORDER — THIAMINE HCL 100 MG/ML IJ SOLN
100.0000 mg | Freq: Every day | INTRAMUSCULAR | Status: DC
  Administered 2018-04-15: 100 mg via INTRAVENOUS
  Filled 2018-04-15: qty 2

## 2018-04-15 MED ORDER — TRAZODONE HCL 50 MG PO TABS
50.0000 mg | ORAL_TABLET | Freq: Every evening | ORAL | Status: DC | PRN
  Administered 2018-04-15: 50 mg via ORAL
  Filled 2018-04-15: qty 1

## 2018-04-15 MED ORDER — SODIUM CHLORIDE 0.9 % IV BOLUS
1000.0000 mL | Freq: Once | INTRAVENOUS | Status: AC
  Administered 2018-04-15: 1000 mL via INTRAVENOUS

## 2018-04-15 MED ORDER — ONDANSETRON HCL 4 MG PO TABS: 4.0000 mg | ORAL_TABLET | Freq: Three times a day (TID) | ORAL | Status: DC | PRN

## 2018-04-15 MED ORDER — IBUPROFEN 200 MG PO TABS: 600.0000 mg | ORAL_TABLET | Freq: Three times a day (TID) | ORAL | Status: DC | PRN
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
## 2018-04-15 NOTE — ED Notes (Signed)
Patient reports SI with intentional over dose. Patient denies HI/AVH. Plan of care discussed. Encouragement and support provided and safety maintain. 1:1 monitoring in place.
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
## 2018-04-15 NOTE — ED Notes (Signed)
Bed: WA29 Expected date:  Expected time:  Means of arrival:  Comments: EMS-OD

## 2018-04-15 NOTE — ED Notes (Signed)
Patient eating sandiwch at this time.

## 2018-04-15 NOTE — ED Notes (Signed)
Report given to Latricia London, RN °
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
## 2018-04-15 NOTE — ED Notes (Signed)
Spoke with poison control, Megan Solis. Megan Solis stated that all poison control needs is an EKG to check QT interval and QRS and if all is good and EKG is normal Poison control will close pts case.

## 2018-04-15 NOTE — ED Provider Notes (Signed)
[facility]-EMERGENCY DEPT Provider Note   CSN: [id number] Arrival date & time: 04/15/18  1415     History   Chief Complaint No chief complaint on file.   HPI Megan Solis is a 42 y.o. female brought in by [facility] for intentional overdose.  Patient took 30 tablets of 200 mg ibuprofen and 40 Tums tablets.  She is states "I just want to go to sleep and never wake up again." She does admit to using some alcohol today.  I have placed her under involuntary commitment.  She denies any other drug abuse.  She is a daily tobacco user.  She denies homicidal ideation or audiovisual hallucinations.  HPI  Past Medical History:  Diagnosis Date  . Abscess of right breast   . Alcohol abuse   . Tobacco abuse     Patient Active Problem List   Diagnosis Date Noted  . Atypical pneumonia 04/08/2018  . Sepsis (HCC) 04/07/2018  . Pneumonia 04/07/2018  . Alcohol withdrawal (HCC) 04/07/2018  . History of seizure 04/07/2018  . Tobacco abuse 04/07/2018    Past Surgical History:  Procedure Laterality Date  . INCISION AND DRAINAGE Right    R breast I&D     OB History   None      Home Medications    Prior to Admission medications   Medication Sig Start Date End Date Taking? Authorizing Provider  Famotidine-Ca Carb-Mag Hydrox (ACID REDUCER + ANTACID PO) Take 40 tablets by mouth once.    Yes [provider]  ibuprofen (ADVIL,MOTRIN) 200 MG tablet Take 200 mg by mouth once.   Yes [provider]  nicotine (NICODERM CQ - DOSED IN MG/24 HOURS) 21 mg/24hr patch Place 1 patch (21 mg total) onto the skin daily. 04/11/18   Briant Cedar, MD  thiamine 100 MG tablet Take 1 tablet (100 mg total) by mouth daily. 04/11/18 05/11/18  Briant Cedar, MD    Family History Family History  Problem Relation Age of Onset  . Cancer Father     Social History Social History   Tobacco Use  . Smoking status: Current Every Day Smoker    Packs/day: 1.00   Types: Cigarettes  . Smokeless tobacco: Never Used  Substance Use Topics  . Alcohol use: Yes  . Drug use: Not Currently     Allergies   Patient has no known allergies.   Review of Systems Review of Systems  Ten systems reviewed and are negative for acute change, except as noted in the HPI.   Physical Exam Updated Vital Signs BP (!) 157/100 (BP Location: Left Arm)   Pulse (!) 101   Temp 98.5 F (36.9 C) (Oral)   Resp 18   Ht 5\' 5"  (1.651 m)   Wt 72.1 kg (159 lb)   LMP 04/04/2018   SpO2 100%   BMI 26.46 kg/m   Physical Exam  Constitutional: She is oriented to person, place, and time. She appears well-developed and well-nourished. No distress.  HENT:  Head: Normocephalic and atraumatic.  Eyes: Conjunctivae are normal. No scleral icterus.  Neck: Normal range of motion.  Cardiovascular: Normal rate, regular rhythm and normal heart sounds. Exam reveals no gallop and no friction rub.  No murmur heard. Pulmonary/Chest: Effort normal and breath sounds normal. No respiratory distress.  Abdominal: Soft. Bowel sounds are normal. She exhibits no distension and no mass. There is no tenderness. There is no guarding.  Neurological: She is alert and oriented to person, place, and  time.  Skin: Skin is warm and dry. She is not diaphoretic.  Psychiatric: Her behavior is normal.  Nursing note and vitals reviewed.    ED Treatments / Results  Labs (all labs ordered are listed, but only abnormal results are displayed) Labs Reviewed  RAPID URINE DRUG SCREEN, HOSP PERFORMED - Abnormal; Notable for the following components:      Result Value   Benzodiazepines POSITIVE (*)    Tetrahydrocannabinol POSITIVE (*)    All other components within normal limits  COMPREHENSIVE METABOLIC PANEL - Abnormal; Notable for the following components:   Calcium 8.6 (*)    All other components within normal limits  ETHANOL - Abnormal; Notable for the following components:   Alcohol, Ethyl (B) 269 (*)      All other components within normal limits  CBC WITH DIFFERENTIAL/PLATELET - Abnormal; Notable for the following components:   Hemoglobin 11.7 (*)    RDW 21.1 (*)    Platelets 526 (*)    Neutro Abs 1.4 (*)    All other components within normal limits  ACETAMINOPHEN LEVEL - Abnormal; Notable for the following components:   Acetaminophen (Tylenol), Serum <10 (*)    All other components within normal limits  ACETAMINOPHEN LEVEL - Abnormal; Notable for the following components:   Acetaminophen (Tylenol), Serum <10 (*)    All other components within normal limits  SALICYLATE LEVEL  I-STAT BETA HCG BLOOD, ED ([facility], [facility], [facility] ONLY)    EKG None  Radiology No results found.  Procedures Procedures (including critical care time)  Medications Ordered in ED Medications  nicotine (NICODERM CQ - dosed in mg/24 hours) patch 21 mg (21 mg Transdermal Refused 04/15/18 1912)  alum & mag hydroxide-simeth (MAALOX/MYLANTA) 200-200-20 MG/5ML suspension 30 mL (has no administration in time range)  ondansetron (ZOFRAN) tablet 4 mg (has no administration in time range)  ibuprofen (ADVIL,MOTRIN) tablet 600 mg (has no administration in time range)  LORazepam (ATIVAN) injection 0-4 mg (2 mg Intravenous Given 04/15/18 1836)    Or  LORazepam (ATIVAN) tablet 0-4 mg ( Oral See Alternative 04/15/18 1836)  LORazepam (ATIVAN) injection 0-4 mg (has no administration in time range)    Or  LORazepam (ATIVAN) tablet 0-4 mg (has no administration in time range)  thiamine (VITAMIN B-1) tablet 100 mg ( Oral See Alternative 04/15/18 1835)    Or  thiamine (B-1) injection 100 mg (100 mg Intravenous Given 04/15/18 1835)  traZODone (DESYREL) tablet 50 mg (50 mg Oral Given 04/15/18 2059)  sodium chloride 0.9 % bolus 1,000 mL (0 mLs Intravenous Stopped 04/15/18 1743)     Initial Impression / Assessment and Plan / ED Course  I have reviewed the triage vital signs and the nursing notes.  Pertinent labs & imaging results that were  available during my care of the patient were reviewed by me and considered in my medical decision making (see chart for details).  Clinical Course as of Apr 15 2238  Tue Apr 15, 2018  1501 4-hour obs, ekg , 4-hour tylenol level and symptomatic treatment.    [AH]  1934 Hemoglobin(!): 11.7 [AH]    Clinical Course User Index [AH] Arthor CaptainHarris, Remus Hagedorn, PA-C     Patient medically clear.  Under IVC  Final Clinical Impressions(s) / ED Diagnoses   Final diagnoses:  Ibuprofen overdose, intentional self-harm, initial encounter [facility](HCC)    ED Discharge Orders    None       Arthor CaptainHarris, Berit Raczkowski, PA-C 04/15/18 2240    Nira Connardama, Pedro Eduardo, MD 04/16/18  1617  

## 2018-04-16 ENCOUNTER — Other Ambulatory Visit: Payer: Self-pay

## 2018-04-16 ENCOUNTER — Encounter (HOSPITAL_COMMUNITY): Payer: Self-pay

## 2018-04-16 ENCOUNTER — Inpatient Hospital Stay (HOSPITAL_COMMUNITY)
Admission: AD | Admit: 2018-04-16 | Discharge: 2018-04-19 | DRG: 885 | Disposition: A | Discharge status: Home / Self Care | Payer: No Typology Code available for payment source | Source: Intra-hospital | Attending: Psychiatry | Admitting: Psychiatry

## 2018-04-16 DIAGNOSIS — F332 Major depressive disorder, recurrent severe without psychotic features: Principal | ICD-10-CM | POA: Diagnosis present

## 2018-04-16 DIAGNOSIS — F17210 Nicotine dependence, cigarettes, uncomplicated: Secondary | ICD-10-CM

## 2018-04-16 DIAGNOSIS — T1491XA Suicide attempt, initial encounter: Secondary | ICD-10-CM | POA: Diagnosis not present

## 2018-04-16 DIAGNOSIS — F321 Major depressive disorder, single episode, moderate: Secondary | ICD-10-CM | POA: Diagnosis not present

## 2018-04-16 DIAGNOSIS — Z23 Encounter for immunization: Secondary | ICD-10-CM

## 2018-04-16 DIAGNOSIS — Y908 Blood alcohol level of 240 mg/100 ml or more: Secondary | ICD-10-CM | POA: Diagnosis present

## 2018-04-16 DIAGNOSIS — F4310 Post-traumatic stress disorder, unspecified: Secondary | ICD-10-CM | POA: Diagnosis present

## 2018-04-16 DIAGNOSIS — Z809 Family history of malignant neoplasm, unspecified: Secondary | ICD-10-CM

## 2018-04-16 DIAGNOSIS — T39312A Poisoning by propionic acid derivatives, intentional self-harm, initial encounter: Secondary | ICD-10-CM

## 2018-04-16 DIAGNOSIS — Z6379 Other stressful life events affecting family and household: Secondary | ICD-10-CM

## 2018-04-16 DIAGNOSIS — Z811 Family history of alcohol abuse and dependence: Secondary | ICD-10-CM | POA: Diagnosis not present

## 2018-04-16 DIAGNOSIS — G4700 Insomnia, unspecified: Secondary | ICD-10-CM | POA: Diagnosis present

## 2018-04-16 DIAGNOSIS — T471X2A Poisoning by other antacids and anti-gastric-secretion drugs, intentional self-harm, initial encounter: Secondary | ICD-10-CM | POA: Diagnosis not present

## 2018-04-16 DIAGNOSIS — R4587 Impulsiveness: Secondary | ICD-10-CM

## 2018-04-16 DIAGNOSIS — Z915 Personal history of self-harm: Secondary | ICD-10-CM

## 2018-04-16 DIAGNOSIS — R569 Unspecified convulsions: Secondary | ICD-10-CM | POA: Diagnosis present

## 2018-04-16 DIAGNOSIS — R45851 Suicidal ideations: Secondary | ICD-10-CM | POA: Diagnosis present

## 2018-04-16 DIAGNOSIS — Z91410 Personal history of adult physical and sexual abuse: Secondary | ICD-10-CM | POA: Diagnosis not present

## 2018-04-16 DIAGNOSIS — F10239 Alcohol dependence with withdrawal, unspecified: Secondary | ICD-10-CM | POA: Diagnosis present

## 2018-04-16 HISTORY — DX: Unspecified convulsions: R56.9

## 2018-04-16 HISTORY — DX: Pneumonia, unspecified organism: J18.9

## 2018-04-16 MED ORDER — PNEUMOCOCCAL VAC POLYVALENT 25 MCG/0.5ML IJ INJ
0.5000 mL | INJECTION | INTRAMUSCULAR | Status: AC
  Administered 2018-04-17: 0.5 mL via INTRAMUSCULAR

## 2018-04-16 MED ORDER — ACETAMINOPHEN 325 MG PO TABS
650.0000 mg | ORAL_TABLET | Freq: Four times a day (QID) | ORAL | Status: DC | PRN
  Administered 2018-04-16: 650 mg via ORAL
  Filled 2018-04-16: qty 2

## 2018-04-16 MED ORDER — CHLORDIAZEPOXIDE HCL 25 MG PO CAPS
25.0000 mg | ORAL_CAPSULE | Freq: Four times a day (QID) | ORAL | Status: DC | PRN
  Administered 2018-04-16 – 2018-04-17 (×3): 25 mg via ORAL
  Filled 2018-04-16 (×3): qty 1

## 2018-04-16 MED ORDER — THIAMINE HCL 100 MG/ML IJ SOLN: 100.0000 mg | Freq: Once | INTRAMUSCULAR | Status: DC

## 2018-04-16 MED ORDER — ONDANSETRON 4 MG PO TBDP
4.0000 mg | ORAL_TABLET | Freq: Four times a day (QID) | ORAL | Status: DC | PRN
  Administered 2018-04-17: 4 mg via ORAL
  Filled 2018-04-16: qty 1

## 2018-04-16 MED ORDER — GABAPENTIN 300 MG PO CAPS
300.0000 mg | ORAL_CAPSULE | Freq: Three times a day (TID) | ORAL | Status: DC
  Administered 2018-04-16: 300 mg via ORAL
  Filled 2018-04-16: qty 1

## 2018-04-16 MED ORDER — HYDROXYZINE HCL 25 MG PO TABS
25.0000 mg | ORAL_TABLET | Freq: Four times a day (QID) | ORAL | Status: DC | PRN
  Administered 2018-04-17 – 2018-04-18 (×3): 25 mg via ORAL
  Filled 2018-04-16 (×4): qty 1

## 2018-04-16 MED ORDER — VITAMIN B-1 100 MG PO TABS
100.0000 mg | ORAL_TABLET | Freq: Every day | ORAL | Status: DC
  Administered 2018-04-17 – 2018-04-19 (×3): 100 mg via ORAL
  Filled 2018-04-16 (×5): qty 1

## 2018-04-16 MED ORDER — LORAZEPAM 1 MG PO TABS
1.0000 mg | ORAL_TABLET | Freq: Once | ORAL | Status: AC
  Administered 2018-04-16: 1 mg via ORAL
  Filled 2018-04-16: qty 1

## 2018-04-16 MED ORDER — TRAZODONE HCL 50 MG PO TABS
50.0000 mg | ORAL_TABLET | Freq: Every evening | ORAL | Status: DC | PRN
  Administered 2018-04-16 – 2018-04-18 (×6): 50 mg via ORAL
  Filled 2018-04-16 (×2): qty 1
  Filled 2018-04-16: qty 2
  Filled 2018-04-16 (×2): qty 1

## 2018-04-16 MED ORDER — GABAPENTIN 300 MG PO CAPS
300.0000 mg | ORAL_CAPSULE | Freq: Three times a day (TID) | ORAL | Status: DC
Start: 2018-04-16 — End: 2018-04-19
  Administered 2018-04-16 – 2018-04-19 (×8): 300 mg via ORAL
  Filled 2018-04-16: qty 21
  Filled 2018-04-16 (×3): qty 1
  Filled 2018-04-16: qty 21
  Filled 2018-04-16 (×3): qty 1
  Filled 2018-04-16 (×2): qty 21
  Filled 2018-04-16 (×6): qty 1
  Filled 2018-04-16 (×2): qty 21

## 2018-04-16 MED ORDER — NICOTINE 21 MG/24HR TD PT24
21.0000 mg | MEDICATED_PATCH | Freq: Every day | TRANSDERMAL | Status: DC
  Administered 2018-04-17 – 2018-04-19 (×3): 21 mg via TRANSDERMAL
  Filled 2018-04-16 (×6): qty 1

## 2018-04-16 MED ORDER — ADULT MULTIVITAMIN W/MINERALS CH
1.0000 | ORAL_TABLET | Freq: Every day | ORAL | Status: DC
  Administered 2018-04-16 – 2018-04-19 (×4): 1 via ORAL
  Filled 2018-04-16 (×7): qty 1

## 2018-04-16 MED ORDER — LOPERAMIDE HCL 2 MG PO CAPS: 2.0000 mg | ORAL_CAPSULE | ORAL | Status: DC | PRN

## 2018-04-16 NOTE — ED Notes (Signed)
Called [facility] for transport to [facility] 

## 2018-04-16 NOTE — Tx Team (Signed)
Initial Treatment Plan 04/16/2018 8:56 PM Megan Solis [id number]    PATIENT STRESSORS: Financial difficulties Health problems Legal issue Marital or family conflict Substance abuse   PATIENT STRENGTHS: Capable of independent living Communication skills General fund of knowledge   PATIENT IDENTIFIED PROBLEMS: Depression  Substance abuse  Suicidal ideation/attempt  "I want to go home"               DISCHARGE CRITERIA:  Improved stabilization in mood, thinking, and/or behavior Verbal commitment to aftercare and medication compliance Withdrawal symptoms are absent or subacute and managed without 24-hour nursing intervention  PRELIMINARY DISCHARGE PLAN: Outpatient therapy Medication management  PATIENT/FAMILY INVOLVEMENT: This treatment plan has been presented to and reviewed with the patient, Megan Solis.  The patient and family have been given the opportunity to ask questions and make suggestions.  Levin BaconHeather V Deagan Sevin, RN 04/16/2018, 8:56 PM
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
## 2018-04-16 NOTE — ED Notes (Signed)
Pt transported to [facility] by [facility]. All belongings returned to pt who signed for same. Pt calm and cooperative.  

## 2018-04-16 NOTE — BH Assessment (Signed)
[facility]  Per Juanetta BeetsJacqueline Norman, DO, this pt requires psychiatric hospitalization.  Berneice Heinrichina Tate, RN, [facility]; [facility] will be ready to receive pt at 16:00.  Pt presents under IVC initiated by EDP Drema PryPedro Cardama, MD, and [facility].  Pt's nurse, Diane, has been notified, and agrees to call report to [phone].  Pt is to be transported via [employer].   Doylene Canninghomas Marinell Igarashi, [place] Behavioral Health Coordinator [phone]

## 2018-04-16 NOTE — ED Notes (Signed)
[facility]. They said to call for pt's transport at 5:00 pm.

## 2018-04-16 NOTE — Consult Note (Addendum)
[facility] Consult   Reason for Consult:  Suicide attempt by drug overdose Referring Physician:  EDP Patient Identification: Megan Solis MRN:  [id number] Principal Diagnosis: MDD (major depressive disorder), single episode, moderate ([place]) Diagnosis:   Patient Active Problem List   Diagnosis Date Noted  . MDD (major depressive disorder), single episode, moderate ([place]) [F32.1]   . Atypical pneumonia [J18.9] 04/08/2018  . Sepsis ([place]) [A41.9] 04/07/2018  . Pneumonia [J18.9] 04/07/2018  . Alcohol withdrawal ([place]) [F10.239] 04/07/2018  . History of seizure [Z87.898] 04/07/2018  . Tobacco abuse [Z72.0] 04/07/2018    Total Time spent with patient: 30 minutes  Subjective:   Megan Solis is a 42 y.o. female patient admitted with suicide attempt by overdose with Ibuprofen and antacid medication.  HPI:   Ms. Megan Solis reports overdosing on Ibuprofen 200 mg tablets (#30), antacids (#40) and possibly Advil to end her life because she has "a lot going on." She reports that her daughter's father is stressing her out. She does not see her daughter often and her daughter's father has full custody. She denies SI today and reports that she feels "stupid" about her attempt. She reports a prior suicide attempt by slitting her wrists several years ago. She required sutures. She denies HI or AVH. She is not seeing a mental health provider and does not take medication. She drinks daily. She reports that her longest period of sobriety was 1.5 months. She has completed detox multiple times. She reports a history of DTs and seizures related to alcohol withdrawal. She has a past history of cocaine use. She denies other illicit substance use.   Past Psychiatric History: Alcohol abuse  Risk to Self: Yes due to recent overdose.  What has been your use of drugs/alcohol within the last 12 months?: Alcohol, marijuana, benzodiazepines, cigarettes. How many times?: 1 Other Self Harm Risks: Pt  reported, cutting her wrist in 2017 as a suicide attempt.  Triggers for Past Attempts: Unknown Intentional Self Injurious Behavior: Cutting Comment - Self Injurious Behavior: Pt reported, cutting her wrist in 2017 as a suicide attempt.  Risk to Others: None. Denies HI.  Prior Inpatient Therapy: Prior Inpatient Therapy: Yes Prior Therapy Dates: UTA Prior Therapy Facilty/Provider(s): Facility in [place], [place]. Reason for Treatment: Detox.  Prior Outpatient Therapy: Prior Outpatient Therapy: No Does patient have an ACCT team?: No Does patient have Intensive In-House Services?  : No Does patient have [facility] services? : No Does patient have P4CC services?: No  Past Medical History:  Past Medical History:  Diagnosis Date  . Abscess of right breast   . Alcohol abuse   . Tobacco abuse     Past Surgical History:  Procedure Laterality Date  . INCISION AND DRAINAGE Right    R breast I&D   Family History:  Family History  Problem Relation Age of Onset  . Cancer Father    Family Psychiatric  History: Denies  Social History:  Social History   Substance and Sexual Activity  Alcohol Use Yes     Social History   Substance and Sexual Activity  Drug Use Not Currently    Social History   Socioeconomic History  . Marital status: Single    Spouse name: Not on file  . Number of children: Not on file  . Years of education: Not on file  . Highest education level: Not on file  Occupational History  . Not on file  Social Needs  . Financial resource strain: Not on file  .  Food insecurity:    Worry: Not on file    Inability: Not on file  . Transportation needs:    Medical: Not on file    Non-medical: Not on file  Tobacco Use  . Smoking status: Current Every Day Smoker    Packs/day: 1.00    Types: Cigarettes  . Smokeless tobacco: Never Used  Substance and Sexual Activity  . Alcohol use: Yes  . Drug use: Not Currently  . Sexual activity: Not on file  Lifestyle  . Physical  activity:    Days per week: Not on file    Minutes per session: Not on file  . Stress: Not on file  Relationships  . Social connections:    Talks on phone: Not on file    Gets together: Not on file    Attends religious service: Not on file    Active member of club or organization: Not on file    Attends meetings of clubs or organizations: Not on file    Relationship status: Not on file  Other Topics Concern  . Not on file  Social History Narrative  . Not on file   Additional Social History: She has a 63 y/o daughter. Her father's daughter has full custody.     Allergies:  No Known Allergies  Labs:  Results for orders placed or performed during the hospital encounter of 04/15/18 (from the past 48 hour(s))  Rapid urine drug screen (hospital performed)     Status: Abnormal   Collection Time: 04/15/18  2:35 PM  Result Value Ref Range   Opiates NONE DETECTED NONE DETECTED   Cocaine NONE DETECTED NONE DETECTED   Benzodiazepines POSITIVE (A) NONE DETECTED   Amphetamines NONE DETECTED NONE DETECTED   Tetrahydrocannabinol POSITIVE (A) NONE DETECTED   Barbiturates NONE DETECTED NONE DETECTED    Comment: (NOTE) DRUG SCREEN FOR MEDICAL PURPOSES ONLY.  IF CONFIRMATION IS NEEDED FOR ANY PURPOSE, NOTIFY LAB WITHIN 5 DAYS. LOWEST DETECTABLE LIMITS FOR URINE DRUG SCREEN Drug Class                     Cutoff (ng/mL) Amphetamine and metabolites    1000 Barbiturate and metabolites    200 Benzodiazepine                 782 Tricyclics and metabolites     300 Opiates and metabolites        300 Cocaine and metabolites        300 THC                            50 Performed at [facility], [place] [address], [place], [place] [address]   Comprehensive metabolic panel     Status: Abnormal   Collection Time: 04/15/18  3:19 PM  Result Value Ref Range   Sodium 144 135 - 145 mmol/L   Potassium 3.8 3.5 - 5.1 mmol/L   Chloride 108 101 - 111 mmol/L   CO2 23 22 - 32 mmol/L    Glucose, Bld 76 65 - 99 mg/dL   BUN 9 6 - 20 mg/dL   Creatinine, Ser 0.75 0.44 - 1.00 mg/dL   Calcium 8.6 (L) 8.9 - 10.3 mg/dL   Total Protein 7.5 6.5 - 8.1 g/dL   Albumin 3.6 3.5 - 5.0 g/dL   AST 21 15 - 41 U/L   ALT 15 14 - 54 U/L   Alkaline Phosphatase 72  38 - 126 U/L   Total Bilirubin 0.4 0.3 - 1.2 mg/dL   GFR calc non Af Amer >60 >60 mL/min   GFR calc Af Amer >60 >60 mL/min    Comment: (NOTE) The eGFR has been calculated using the CKD EPI equation. This calculation has not been validated in all clinical situations. eGFR's persistently <60 mL/min signify possible Chronic Kidney Disease.    Anion gap 13 5 - 15    Comment: Performed at [facility], [place] [address] [place], [place] [address]  Ethanol     Status: Abnormal   Collection Time: 04/15/18  3:19 PM  Result Value Ref Range   Alcohol, Ethyl (B) 269 (H) <10 mg/dL    Comment:        LOWEST DETECTABLE LIMIT FOR SERUM ALCOHOL IS 10 mg/dL FOR MEDICAL PURPOSES ONLY Performed at [facility], [place] [address] [place], [place] [address]   CBC with Diff     Status: Abnormal   Collection Time: 04/15/18  3:19 PM  Result Value Ref Range   WBC 5.3 4.0 - 10.5 K/uL   RBC 4.00 3.87 - 5.11 MIL/uL   Hemoglobin 11.7 (L) 12.0 - 15.0 g/dL   HCT 36.4 36.0 - 46.0 %   MCV 91.0 78.0 - 100.0 fL   MCH 29.3 26.0 - 34.0 pg   MCHC 32.1 30.0 - 36.0 g/dL   RDW 21.1 (H) 11.5 - 15.5 %   Platelets 526 (H) 150 - 400 K/uL   Neutrophils Relative % 27 %   Lymphocytes Relative 55 %   Monocytes Relative 11 %   Eosinophils Relative 6 %   Basophils Relative 1 %   Neutro Abs 1.4 (L) 1.7 - 7.7 K/uL   Lymphs Abs 2.9 0.7 - 4.0 K/uL   Monocytes Absolute 0.6 0.1 - 1.0 K/uL   Eosinophils Absolute 0.3 0.0 - 0.7 K/uL   Basophils Absolute 0.1 0.0 - 0.1 K/uL   Smear Review MORPHOLOGY UNREMARKABLE     Comment: Performed at [facility], [place] [address] [place], [place] [address]  Salicylate level      Status: None   Collection Time: 04/15/18  3:19 PM  Result Value Ref Range   Salicylate Lvl <2.4 2.8 - 30.0 mg/dL    Comment: Performed at [facility], [place] [address] [place], [place] [address]  Acetaminophen level     Status: Abnormal   Collection Time: 04/15/18  3:19 PM  Result Value Ref Range   Acetaminophen (Tylenol), Serum <10 (L) 10 - 30 ug/mL    Comment:        THERAPEUTIC CONCENTRATIONS VARY SIGNIFICANTLY. A RANGE OF 10-30 ug/mL MAY BE AN EFFECTIVE CONCENTRATION FOR MANY PATIENTS. HOWEVER, SOME ARE BEST TREATED AT CONCENTRATIONS OUTSIDE THIS RANGE. ACETAMINOPHEN CONCENTRATIONS >150 ug/mL AT 4 HOURS AFTER INGESTION AND >50 ug/mL AT 12 HOURS AFTER INGESTION ARE OFTEN ASSOCIATED WITH TOXIC REACTIONS. Performed at [facility], [place] [address], [place], [place] [address]   I-Stat beta hCG blood, ED     Status: None   Collection Time: 04/15/18  3:26 PM  Result Value Ref Range   I-stat hCG, quantitative <5.0 <5 mIU/mL   Comment 3            Comment:   GEST. AGE      CONC.  (mIU/mL)   <=1 WEEK        5 - 50     2 WEEKS  50 - 500     3 WEEKS       100 - 10,000     4 WEEKS     1,000 - 30,000        FEMALE AND NON-PREGNANT FEMALE:     LESS THAN 5 mIU/mL   Acetaminophen level     Status: Abnormal   Collection Time: 04/15/18  5:21 PM  Result Value Ref Range   Acetaminophen (Tylenol), Serum <10 (L) 10 - 30 ug/mL    Comment:        THERAPEUTIC CONCENTRATIONS VARY SIGNIFICANTLY. A RANGE OF 10-30 ug/mL MAY BE AN EFFECTIVE CONCENTRATION FOR MANY PATIENTS. HOWEVER, SOME ARE BEST TREATED AT CONCENTRATIONS OUTSIDE THIS RANGE. ACETAMINOPHEN CONCENTRATIONS >150 ug/mL AT 4 HOURS AFTER INGESTION AND >50 ug/mL AT 12 HOURS AFTER INGESTION ARE OFTEN ASSOCIATED WITH TOXIC REACTIONS. Performed at [facility], [place] [address], [place], [place] [address]     Current Facility-Administered Medications  Medication Dose  Route Frequency Provider Last Rate Last Dose  . alum & mag hydroxide-simeth (MAALOX/MYLANTA) 200-200-20 MG/5ML suspension 30 mL  30 mL Oral Q6H PRN Harris, Abigail, PA-C      . ibuprofen (ADVIL,MOTRIN) tablet 600 mg  600 mg Oral Q8H PRN Margarita Mail, PA-C      . LORazepam (ATIVAN) injection 0-4 mg  0-4 mg Intravenous Q6H Harris, Abigail, PA-C   2 mg at 04/15/18 1836   Or  . LORazepam (ATIVAN) tablet 0-4 mg  0-4 mg Oral Q6H Harris, Abigail, PA-C   Stopped at 04/15/18 2353  . [START ON 04/18/2018] LORazepam (ATIVAN) injection 0-4 mg  0-4 mg Intravenous Q12H Harris, Abigail, PA-C       Or  . Derrill Memo ON 04/18/2018] LORazepam (ATIVAN) tablet 0-4 mg  0-4 mg Oral Q12H Harris, Abigail, PA-C      . nicotine (NICODERM CQ - dosed in mg/24 hours) patch 21 mg  21 mg Transdermal Daily Harris, Abigail, PA-C   21 mg at 04/16/18 1012  . ondansetron (ZOFRAN) tablet 4 mg  4 mg Oral Q8H PRN Harris, Abigail, PA-C      . thiamine (VITAMIN B-1) tablet 100 mg  100 mg Oral Daily Harris, Abigail, PA-C   100 mg at 04/16/18 1012   Or  . thiamine (B-1) injection 100 mg  100 mg Intravenous Daily Margarita Mail, PA-C   100 mg at 04/15/18 1835  . traZODone (DESYREL) tablet 50 mg  50 mg Oral QHS PRN,MR X 1 Laverle Hobby, PA-C   50 mg at 04/15/18 2059   Current Outpatient Medications  Medication Sig Dispense Refill  . Famotidine-Ca Carb-Mag Hydrox (ACID REDUCER + ANTACID PO) Take 40 tablets by mouth once.     Marland Kitchen ibuprofen (ADVIL,MOTRIN) 200 MG tablet Take 200 mg by mouth once.    . nicotine (NICODERM CQ - DOSED IN MG/24 HOURS) 21 mg/24hr patch Place 1 patch (21 mg total) onto the skin daily. 28 patch 0  . thiamine 100 MG tablet Take 1 tablet (100 mg total) by mouth daily. 30 tablet 0    Musculoskeletal: Strength & Muscle Tone: within normal limits Gait & Station: normal Patient leans: N/A  Psychiatric Specialty Exam: Physical Exam  Nursing note and vitals reviewed. Constitutional: She is oriented to person, place,  and time. She appears well-developed and well-nourished.  HENT:  Head: Normocephalic and atraumatic.  Neck: Normal range of motion.  Respiratory: Effort normal.  Musculoskeletal: Normal range of motion.  Neurological: She is alert and oriented to  person, place, and time.  Skin: No rash noted.  Psychiatric: Her speech is normal and behavior is normal. Thought content normal. Cognition and memory are normal. She expresses impulsivity. She exhibits a depressed mood.    Review of Systems  Psychiatric/Behavioral: Positive for depression and substance abuse. Negative for hallucinations and suicidal ideas.  All other systems reviewed and are negative.   Blood pressure (!) 151/85, pulse 88, temperature 98.2 F (36.8 C), temperature source Oral, resp. rate 16, height '5\' 5"'  (1.651 m), weight 72.1 kg (159 lb), last menstrual period 04/04/2018, SpO2 96 %.Body mass index is 26.46 kg/m.  General Appearance: Fairly Groomed, middle aged, Caucasian female wearing paper hospital scrubs and lying in bed. NAD.   Eye Contact:  Good  Speech:  Clear and Coherent and Normal Rate  Volume:  Normal  Mood:  Dysphoric  Affect:  Depressed  Thought Process:  Goal Directed, Linear and Descriptions of Associations: Intact  Orientation:  Full (Time, Place, and Person)  Thought Content:  Logical  Suicidal Thoughts:  No  Homicidal Thoughts:  No  Memory:  Immediate;   Good Recent;   Good Remote;   Good  Judgement:  Fair  Insight:  Good and Fair  Psychomotor Activity:  Normal  Concentration:  Concentration: Good and Attention Span: Good  Recall:  Good  Fund of Knowledge:  Good  Language:  Good  Akathisia:  No  Handed:  Right  AIMS (if indicated):   N/A  Assets:  Communication Skills Housing  ADL's:  Intact  Cognition:  WNL  Sleep:   N/A   Assessment:  BENNY HENRIE is a 42 y.o. female who was admitted with suicide attempt by overdose on Ibuprofen and antacid medication in the setting of psychosocial  stressors and alcohol intoxication. She warrants inpatient psychiatric hospitalization due to high risk of harm to self with ongoing stressors.   Treatment Plan Summary: Daily contact with patient to assess and evaluate symptoms and progress in treatment and Medication management  -Start Gabapentin 300 mg TID for alcohol withdrawal.  -Admit to [facility] bed 302-2.   Disposition: Recommend psychiatric Inpatient admission when medically cleared.  Faythe Dingwall, DO 04/16/2018 12:28 PM

## 2018-04-17 MED ORDER — ACAMPROSATE CALCIUM 333 MG PO TBEC
666.0000 mg | DELAYED_RELEASE_TABLET | Freq: Three times a day (TID) | ORAL | Status: DC
  Administered 2018-04-17 – 2018-04-19 (×6): 666 mg via ORAL
  Filled 2018-04-17 (×4): qty 2
  Filled 2018-04-17: qty 42
  Filled 2018-04-17: qty 2
  Filled 2018-04-17 (×2): qty 42
  Filled 2018-04-17 (×3): qty 2
  Filled 2018-04-17 (×2): qty 42
  Filled 2018-04-17 (×3): qty 2
  Filled 2018-04-17: qty 42

## 2018-04-17 MED ORDER — AMOXICILLIN-POT CLAVULANATE 500-125 MG PO TABS
1.0000 | ORAL_TABLET | Freq: Three times a day (TID) | ORAL | Status: DC
  Administered 2018-04-17 – 2018-04-19 (×6): 500 mg via ORAL
  Filled 2018-04-17: qty 21
  Filled 2018-04-17: qty 1
  Filled 2018-04-17 (×3): qty 21
  Filled 2018-04-17 (×7): qty 1
  Filled 2018-04-17: qty 21
  Filled 2018-04-17 (×3): qty 1
  Filled 2018-04-17: qty 21
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
## 2018-04-17 NOTE — Plan of Care (Addendum)
Nurse discussed anxiety, depression, coping skills with patient. 

## 2018-04-17 NOTE — Progress Notes (Signed)
Patient ID: Megan Solis, female   DOB: [birth year], 42 y.o.   MRN: [id number]   Pt currently presents with an agitated affect and restless, guarded behavior. Pt remains on the phone tonight for an extended period of time. Reports ongoing ambivalence about stay and sobriety. Pt states "My dad told me that AA/NA meetings are for quitters, that's my perspective."   Pt provided with medications per providers orders. Pt's labs and vitals were monitored throughout the night. Pt given a 1:1 about emotional and mental status. Pt supported and encouraged to express concerns and questions. Pt educated on medications.  Pt's safety ensured with 15 minute and environmental checks. Pt currently denies SI/HI and A/V hallucinations. Pt verbally agrees to seek staff if SI/HI or A/VH occurs and to consult with staff before acting on any harmful thoughts. Will continue POC.

## 2018-04-17 NOTE — Progress Notes (Addendum)
D:  Patient's self inventory sheet, patient has fair sleep, sleep medication not helpful.  Fair appetite, low energy level, poor concentration.  Rated depression 6, hopeless 3, denied anxiety.  Withdrawals, tremors, diarrhea, chilling, cravings, runny nose.  Denied SI.  Headaches, pain, worst pain in past 24 hours is #6, stomach and chest.  No pain medicine.  Denied Pain.  Goal is sleep and be alone.  Not sure of plans. A:  Medications administered per MD orders.  Emotional support and encouragement given patient R:  Patient denied SI and HI, contracts for safety.  Denied A/V hallucinations.
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
## 2018-04-17 NOTE — Progress Notes (Signed)
D   Pt complaining of anxiety and irritability and craving ETOH   She became calmer and more in control and apologized for earlier behaviors when she just woke up    Pt took medications without difficulty and said she would try to do better and would try to go to some groups A    Verbal support given    Medications administered and effectiveness monitored    Q 15 min checks R   Pt is safe at present time
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
## 2018-04-17 NOTE — [facility] Suicide Risk Assessment (Addendum)
[facility] INPATIENT:  Family/Significant Other Suicide Prevention Education  Suicide Prevention Education:  Education Completed; Megan Solis, boyfriend [phone]) has been identified by the patient as the family member/significant other with whom the patient will be residing, and identified as the person(s) who will aid the patient in the event of a mental health crisis (suicidal ideations/suicide attempt).  With written consent from the patient, the family member/significant other has been provided the following suicide prevention education, prior to the and/or following the discharge of the patient.  The suicide prevention education provided includes the following:  Suicide risk factors  Suicide prevention and interventions  National Suicide Hotline telephone number  [facility] telephone number  [facility] 911  [facility] and/or [facility]idential [facility] telephone number  Request made of family/significant other to:  Remove weapons (e.g., guns, rifles, knives), all items previously/currently identified as safety concern.    Remove drugs/medications (over-the-counter, prescriptions, illicit drugs), all items previously/currently identified as a safety concern.  The family member/significant other verbalizes understanding of the suicide prevention education information provided.  The family member/significant other agrees to remove the items of safety concern listed above.  Megan Solis 04/17/2018, 11:24 AM

## 2018-04-17 NOTE — Progress Notes (Signed)
Pt did not attend wrap up group. Pt came in the room while group going on demanding to use the phone. Staff remind pt during group phone is off. Pt very rude. Pt said she should be able to use the phone whenever she needs to. RN notify

## 2018-04-18 MED ORDER — CHLORDIAZEPOXIDE HCL 25 MG PO CAPS: 25.0000 mg | ORAL_CAPSULE | Freq: Three times a day (TID) | ORAL | Status: DC | PRN

## 2018-04-18 MED ORDER — ATENOLOL 25 MG PO TABS
12.5000 mg | ORAL_TABLET | Freq: Two times a day (BID) | ORAL | Status: DC
  Administered 2018-04-18 – 2018-04-19 (×3): 12.5 mg via ORAL
  Filled 2018-04-18: qty 7
  Filled 2018-04-18: qty 0.5
  Filled 2018-04-18: qty 7
  Filled 2018-04-18: qty 0.5
  Filled 2018-04-18: qty 7
  Filled 2018-04-18 (×2): qty 0.5
  Filled 2018-04-18: qty 1
  Filled 2018-04-18: qty 7
  Filled 2018-04-18: qty 0.5

## 2018-04-18 NOTE — Progress Notes (Signed)
[facility]  04/18/2018 10:19 AM Megan CoralDorothy E Solis  MRN:  [id number] Subjective: Patient is seen and examined.  Patient's 42 year old female with a past psychiatric history significant for alcohol dependence as well as probable posttraumatic stress disorder.  She is seen in follow-up.  She is initially polite and pleasant, but then becomes quite distressed.  She states she wanted to go home today.  I told her that she had just basically been here for 1 day, and had received Librium to prevent alcohol withdrawal problems, and that she would have to be watched at least another 24 hours.  She stated she wants to go to work and she wants to get outside.  She is quite tearful.  She has limited insight about the alcohol here.  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  I made the point of telling her that she be driving other people in the vehicle, and she still somewhat altered and in withdrawal and that might endanger herself as well as others.  She denied any suicidal ideation.  Her blood pressure is still elevated and she is at times tachycardic.  This may still be related to alcohol withdrawal. Principal Problem: <principal problem not specified> Diagnosis:   Patient Active Problem List   Diagnosis Date Noted  . MDD (major depressive disorder), recurrent severe, without psychosis (HCC) [F33.2] 04/16/2018  . MDD (major depressive disorder), single episode, moderate (HCC) [F32.1]   . Atypical pneumonia [J18.9] 04/08/2018  . Sepsis (HCC) [A41.9] 04/07/2018  . Pneumonia [J18.9] 04/07/2018  . Alcohol withdrawal (HCC) [F10.239] 04/07/2018  . History of seizure [Z87.898] 04/07/2018  . Tobacco abuse [Z72.0] 04/07/2018   Total Time spent with patient: 20 minutes  Past Psychiatric History: See admission H&P  Past Medical History:  Past Medical History:  Diagnosis Date  . Abscess of right breast   . Alcohol abuse   . Pneumonia   . Seizures (HCC)     only when coming off alcohol  . Tobacco abuse     Past Surgical History:  Procedure Laterality Date  . INCISION AND DRAINAGE Right    R breast I&D   Family History:  Family History  Problem Relation Age of Onset  . Cancer Father    Family Psychiatric  History: See admission H&P Social History:  Social History   Substance and Sexual Activity  Alcohol Use Yes   Comment: 1/5 liquor daily     Social History   Substance and Sexual Activity  Drug Use Not Currently    Social History   Socioeconomic History  . Marital status: Single    Spouse name: Not on file  . Number of children: Not on file  . Years of education: Not on file  . Highest education level: Not on file  Occupational History  . Not on file  Social Needs  . Financial resource strain: Not on file  . Food insecurity:    Worry: Not on file    Inability: Not on file  . Transportation needs:    Medical: Not on file    Non-medical: Not on file  Tobacco Use  . Smoking status: Current Every Day Smoker    Packs/day: 1.00    Types: Cigarettes  . Smokeless tobacco: Never Used  Substance and Sexual Activity  . Alcohol use: Yes    Comment: 1/5 liquor daily  . Drug use: Not Currently  . Sexual activity:  Yes    Birth control/protection: Condom  Lifestyle  . Physical activity:    Days per week: Not on file    Minutes per session: Not on file  . Stress: Not on file  Relationships  . Social connections:    Talks on phone: Not on file    Gets together: Not on file    Attends religious service: Not on file    Active member of club or organization: Not on file    Attends meetings of clubs or organizations: Not on file    Relationship status: Not on file  Other Topics Concern  . Not on file  Social History Narrative  . Not on file   Additional Social History:                         Sleep: Good  Appetite:  Good  Current Medications: Current Facility-Administered Medications  Medication Dose  Route Frequency Provider Last Rate Last Dose  . acamprosate (CAMPRAL) tablet 666 mg  666 mg Oral TID WC Antonieta Pert, MD   666 mg at 04/18/18 9604  . amoxicillin-clavulanate (AUGMENTIN) 500-125 MG per tablet 500 mg  1 tablet Oral TID Antonieta Pert, MD   500 mg at 04/18/18 0830  . chlordiazePOXIDE (LIBRIUM) capsule 25 mg  25 mg Oral Q8H PRN Antonieta Pert, MD      . gabapentin (NEURONTIN) capsule 300 mg  300 mg Oral TID Cherly Beach, DO   300 mg at 04/18/18 5409  . hydrOXYzine (ATARAX/VISTARIL) tablet 25 mg  25 mg Oral Q6H PRN Rankin, Shuvon B, NP   25 mg at 04/17/18 2116  . loperamide (IMODIUM) capsule 2-4 mg  2-4 mg Oral PRN Rankin, Shuvon B, NP      . multivitamin with minerals tablet 1 tablet  1 tablet Oral Daily Rankin, Shuvon B, NP   1 tablet at 04/18/18 8119  . nicotine (NICODERM CQ - dosed in mg/24 hours) patch 21 mg  21 mg Transdermal Daily Antonieta Pert, MD   21 mg at 04/18/18 1478  . ondansetron (ZOFRAN-ODT) disintegrating tablet 4 mg  4 mg Oral Q6H PRN Rankin, Shuvon B, NP   4 mg at 04/17/18 0811  . thiamine (B-1) injection 100 mg  100 mg Intramuscular Once Rankin, Shuvon B, NP      . thiamine (VITAMIN B-1) tablet 100 mg  100 mg Oral Daily Rankin, Shuvon B, NP   100 mg at 04/18/18 2956  . traZODone (DESYREL) tablet 50 mg  50 mg Oral QHS PRN,MR X 1 Rankin, Shuvon B, NP   50 mg at 04/17/18 2118    Lab Results: No results found for this or any previous visit (from the past 48 hour(s)).  Blood Alcohol level:  Lab Results  Component Value Date   ETH 269 (H) 04/15/2018    Metabolic Disorder Labs: No results found for: HGBA1C, MPG No results found for: PROLACTIN No results found for: CHOL, TRIG, HDL, CHOLHDL, VLDL, LDLCALC  Physical Findings: AIMS: Facial and Oral Movements Muscles of Facial Expression: None, normal Lips and Perioral Area: None, normal Jaw: None, normal Tongue: None, normal,Extremity Movements Upper (arms, wrists, hands, fingers):  None, normal Lower (legs, knees, ankles, toes): None, normal, Trunk Movements Neck, shoulders, hips: None, normal, Overall Severity Severity of abnormal movements (highest score from questions above): None, normal Incapacitation due to abnormal movements: None, normal Patient's awareness of abnormal movements (rate only patient's report): No Awareness,  Dental Status Current problems with teeth and/or dentures?: No Does patient usually wear dentures?: No  CIWA:  CIWA-Ar Total: 6 COWS:  COWS Total Score: 6  Musculoskeletal: Strength & Muscle Tone: within normal limits Gait & Station: normal Patient leans: N/A  Psychiatric Specialty Exam: Physical Exam  Nursing note and vitals reviewed. Constitutional: She is oriented to person, place, and time. She appears well-developed and well-nourished.  HENT:  Head: Normocephalic and atraumatic.  Respiratory: Effort normal.  Musculoskeletal: Normal range of motion.  Neurological: She is alert and oriented to person, place, and time.    ROS  Blood pressure (!) 139/105, pulse (!) 119, temperature (!) 97.4 F (36.3 C), temperature source Oral, resp. rate 16, height 5' 1.5" (1.562 m), weight 72.6 kg (160 lb), last menstrual period 04/04/2018.Body mass index is 29.74 kg/m.  General Appearance: Disheveled  Eye Contact:  Fair  Speech:  Normal Rate  Volume:  Normal  Mood:  Irritable  Affect:  Labile  Thought Process:  Coherent  Orientation:  Full (Time, Place, and Person)  Thought Content:  Logical  Suicidal Thoughts:  No  Homicidal Thoughts:  No  Memory:  Immediate;   Fair  Judgement:  Impaired  Insight:  Lacking  Psychomotor Activity:  Increased  Concentration:  Concentration: Fair  Recall:  [employer] of Knowledge:  Fair  Language:  Fair  Akathisia:  Negative  Handed:  Right  AIMS (if indicated):     Assets:  Desire for Improvement  ADL's:  Intact  Cognition:  WNL  Sleep:  Number of Hours: 6.75     Treatment Plan  Summary: Daily contact with patient to assess and evaluate symptoms and progress in treatment, Medication management and Plan Patient is seen and examined.  Patient is a 42 year old female with the above-stated past psychiatric history is seen in follow-up.  She is upset today because I will not discharge her.  I told her she is only been here 24 hours, and that basically she is still in withdrawal.  She wants to go back to work, and part of her job is driving people, and I am uncomfortable with her at this point.  I will decrease her Librium to every 8 hours as needed.  I am going to go on and add atenolol for blood pressure and tachycardia at least at this point.  No other changes in her medications.  Antonieta Pert, MD 04/18/2018, 10:19 AM

## 2018-04-18 NOTE — Progress Notes (Signed)
The patient attended the evening A.A.meeting without incident. Prior to the start of the group, she expressed some reluctance to attending the meeting.

## 2018-04-18 NOTE — Tx Team (Signed)
Interdisciplinary Treatment and Diagnostic Plan Update  04/18/2018 Time of Session: 9:05am Megan Solis MRN: [id number]  Principal Diagnosis: <principal problem not specified>  Secondary Diagnoses: Active Problems:   MDD (major depressive disorder), recurrent severe, without psychosis ([place])   Current Medications:  Current Facility-Administered Medications  Medication Dose Route Frequency Provider Last Rate Last Dose  . acamprosate (CAMPRAL) tablet 666 mg  666 mg Oral TID WC Sharma Covert, MD   666 mg at 04/18/18 1287  . amoxicillin-clavulanate (AUGMENTIN) 500-125 MG per tablet 500 mg  1 tablet Oral TID Sharma Covert, MD   500 mg at 04/18/18 0830  . chlordiazePOXIDE (LIBRIUM) capsule 25 mg  25 mg Oral Q6H PRN Rankin, Shuvon B, NP   25 mg at 04/17/18 2116  . gabapentin (NEURONTIN) capsule 300 mg  300 mg Oral TID Faythe Dingwall, DO   300 mg at 04/18/18 8676  . hydrOXYzine (ATARAX/VISTARIL) tablet 25 mg  25 mg Oral Q6H PRN Rankin, Shuvon B, NP   25 mg at 04/17/18 2116  . loperamide (IMODIUM) capsule 2-4 mg  2-4 mg Oral PRN Rankin, Shuvon B, NP      . multivitamin with minerals tablet 1 tablet  1 tablet Oral Daily Rankin, Shuvon B, NP   1 tablet at 04/18/18 7209  . nicotine (NICODERM CQ - dosed in mg/24 hours) patch 21 mg  21 mg Transdermal Daily Sharma Covert, MD   21 mg at 04/18/18 4709  . ondansetron (ZOFRAN-ODT) disintegrating tablet 4 mg  4 mg Oral Q6H PRN Rankin, Shuvon B, NP   4 mg at 04/17/18 0811  . thiamine (B-1) injection 100 mg  100 mg Intramuscular Once Rankin, Shuvon B, NP      . thiamine (VITAMIN B-1) tablet 100 mg  100 mg Oral Daily Rankin, Shuvon B, NP   100 mg at 04/18/18 6283  . traZODone (DESYREL) tablet 50 mg  50 mg Oral QHS PRN,MR X 1 Rankin, Shuvon B, NP   50 mg at 04/17/18 2118   PTA Medications: Medications Prior to Admission  Medication Sig Dispense Refill Last Dose  . Famotidine-Ca Carb-Mag Hydrox (ACID REDUCER + ANTACID PO) Take 40  tablets by mouth once.    04/15/2018 at Unknown time  . ibuprofen (ADVIL,MOTRIN) 200 MG tablet Take 200 mg by mouth once.   04/15/2018 at Unknown time  . nicotine (NICODERM CQ - DOSED IN MG/24 HOURS) 21 mg/24hr patch Place 1 patch (21 mg total) onto the skin daily. 28 patch 0   . thiamine 100 MG tablet Take 1 tablet (100 mg total) by mouth daily. 30 tablet 0     Patient Stressors: Financial difficulties Health problems Legal issue Marital or family conflict Substance abuse  Patient Strengths: Capable of independent living Curator fund of knowledge  Treatment Modalities: Medication Management, Group therapy, Case management,  1 to 1 session with clinician, Psychoeducation, Recreational therapy.   Physician Treatment Plan for Primary Diagnosis: <principal problem not specified> Long Term Goal(s): Improvement in symptoms so as ready for discharge Improvement in symptoms so as ready for discharge   Short Term Goals: Ability to identify changes in lifestyle to reduce recurrence of condition will improve Ability to verbalize feelings will improve Ability to disclose and discuss suicidal ideas Ability to demonstrate self-control will improve Ability to identify and develop effective coping behaviors will improve Ability to maintain clinical measurements within normal limits will improve Compliance with prescribed medications will improve Ability to identify triggers associated with  substance abuse/mental health issues will improve Ability to identify changes in lifestyle to reduce recurrence of condition will improve Ability to verbalize feelings will improve Ability to disclose and discuss suicidal ideas Ability to demonstrate self-control will improve Ability to identify and develop effective coping behaviors will improve Ability to maintain clinical measurements within normal limits will improve Compliance with prescribed medications will improve Ability to  identify triggers associated with substance abuse/mental health issues will improve  Medication Management: Evaluate patient's response, side effects, and tolerance of medication regimen.  Therapeutic Interventions: 1 to 1 sessions, Unit Group sessions and Medication administration.  Evaluation of Outcomes: Not Met  Physician Treatment Plan for Secondary Diagnosis: Active Problems:   MDD (major depressive disorder), recurrent severe, without psychosis ([place])  Long Term Goal(s): Improvement in symptoms so as ready for discharge Improvement in symptoms so as ready for discharge   Short Term Goals: Ability to identify changes in lifestyle to reduce recurrence of condition will improve Ability to verbalize feelings will improve Ability to disclose and discuss suicidal ideas Ability to demonstrate self-control will improve Ability to identify and develop effective coping behaviors will improve Ability to maintain clinical measurements within normal limits will improve Compliance with prescribed medications will improve Ability to identify triggers associated with substance abuse/mental health issues will improve Ability to identify changes in lifestyle to reduce recurrence of condition will improve Ability to verbalize feelings will improve Ability to disclose and discuss suicidal ideas Ability to demonstrate self-control will improve Ability to identify and develop effective coping behaviors will improve Ability to maintain clinical measurements within normal limits will improve Compliance with prescribed medications will improve Ability to identify triggers associated with substance abuse/mental health issues will improve     Medication Management: Evaluate patient's response, side effects, and tolerance of medication regimen.  Therapeutic Interventions: 1 to 1 sessions, Unit Group sessions and Medication administration.  Evaluation of Outcomes: Not Met   RN Treatment Plan for  Primary Diagnosis: <principal problem not specified> Long Term Goal(s): Knowledge of disease and therapeutic regimen to maintain health will improve  Short Term Goals: Ability to remain free from injury will improve, Ability to verbalize frustration and anger appropriately will improve, Ability to demonstrate self-control, Ability to participate in decision making will improve, Ability to verbalize feelings will improve, Ability to disclose and discuss suicidal ideas, Ability to identify and develop effective coping behaviors will improve and Compliance with prescribed medications will improve  Medication Management: RN will administer medications as ordered by provider, will assess and evaluate patient's response and provide education to patient for prescribed medication. RN will report any adverse and/or side effects to prescribing provider.  Therapeutic Interventions: 1 on 1 counseling sessions, Psychoeducation, Medication administration, Evaluate responses to treatment, Monitor vital signs and CBGs as ordered, Perform/monitor CIWA, COWS, AIMS and Fall Risk screenings as ordered, Perform wound care treatments as ordered.  Evaluation of Outcomes: Not Met   LCSW Treatment Plan for Primary Diagnosis: <principal problem not specified> Long Term Goal(s): Safe transition to appropriate next level of care at discharge, Engage patient in therapeutic group addressing interpersonal concerns.  Short Term Goals: Engage patient in aftercare planning with referrals and resources, Increase social support, Increase ability to appropriately verbalize feelings, Increase emotional regulation, Facilitate acceptance of mental health diagnosis and concerns, Facilitate patient progression through stages of change regarding substance use diagnoses and concerns, Identify triggers associated with mental health/substance abuse issues and Increase skills for wellness and recovery  Therapeutic Interventions: Assess for all  discharge needs, 1 to 1 time with Education officer, museum, Explore available resources and support systems, Assess for adequacy in community support network, Educate family and significant other(s) on suicide prevention, Complete Psychosocial Assessment, Interpersonal group therapy.  Evaluation of Outcomes: Not Met   Progress in Treatment: Attending groups: No. Participating in groups: No. Taking medication as prescribed: Yes. Toleration medication: Yes. Family/Significant other contact made: Yes, individual(s) contacted:  patient's boyfriend Patient understands diagnosis: Yes. Discussing patient identified problems/goals with staff: Yes. Medical problems stabilized or resolved: Yes. Denies suicidal/homicidal ideation: Yes. Issues/concerns per patient self-inventory: No. Other:   New problem(s) identified: None  New Short Term/Long Term Goal(s):Detox, medication stabilization, elimination of SI thoughts, development of comprehensive mental wellness plan.   Patient Goals: "I dont know"  Discharge Plan or Barriers: CSW still assessing for an appropriate discharge plan.   Reason for Continuation of Hospitalization: Depression Medication stabilization Withdrawal symptoms  Estimated Length of Stay: Monday, 04/21/18  Attendees: Patient: Megan Solis 04/18/2018 10:14 AM  Physician: Dr. Myles Lipps, MD 04/18/2018 10:14 AM  Nursing: [place], RN 04/18/2018 10:14 AM  RN Care Manager: Rhunette Croft 04/18/2018 10:14 AM  Social Worker: Radonna Ricker, [place] 04/18/2018 10:14 AM  Recreational Therapist: X 04/18/2018 10:14 AM  Other: X 04/18/2018 10:14 AM  Other: X 04/18/2018 10:14 AM  Other: Rhunette Croft 04/18/2018 10:14 AM    Scribe for Treatment Team: Marylee Floras, [place] 04/18/2018 10:14 AM

## 2018-04-18 NOTE — Progress Notes (Signed)
D: Patient denies any depressive symptoms.  She has minimal withdrawal symptoms.  She is sleeping and eating well; her energy level is normal.  Patient was focused on discharge today.  Her blood pressure was elevated this morning and MD was notified of same.  She is somewhat irritable with staff.    A: Continue to monitor medication management and MD orders.  Safety checks completed every 15 minutes per protocol.  Offer support and encouragement as needed.  R: Patient has minimal interaction with staff.

## 2018-04-18 NOTE — Progress Notes (Signed)
Recreation Therapy Notes  Date: 4.26.19 Time: 0930 Location: [address] Dayroom  Group Topic: Stress Management  Goal Area(s) Addresses:  Patient will verbalize importance of using healthy stress management.  Patient will identify positive emotions associated with healthy stress management.   Intervention: Stress Management  Activity : Gratitude Meditation.  LRT played a meditation on gratitude.  Patients were to listen and follow along as the meditation played to fully engage in the activity.  Education:  Stress Management, Discharge Planning.   Education Outcome: Acknowledges edcuation/In group clarification offered/Needs additional education  Clinical Observations/Feedback: Pt did not attend group.    Atharva Mirsky, LRT/CTRS         Art Levan A 04/18/2018 10:32 AM 

## 2018-04-19 DIAGNOSIS — T39312A Poisoning by propionic acid derivatives, intentional self-harm, initial encounter: Secondary | ICD-10-CM

## 2018-04-19 DIAGNOSIS — Z915 Personal history of self-harm: Secondary | ICD-10-CM

## 2018-04-19 DIAGNOSIS — Z811 Family history of alcohol abuse and dependence: Secondary | ICD-10-CM

## 2018-04-19 DIAGNOSIS — F17210 Nicotine dependence, cigarettes, uncomplicated: Secondary | ICD-10-CM

## 2018-04-19 DIAGNOSIS — T471X2A Poisoning by other antacids and anti-gastric-secretion drugs, intentional self-harm, initial encounter: Secondary | ICD-10-CM

## 2018-04-19 DIAGNOSIS — F332 Major depressive disorder, recurrent severe without psychotic features: Principal | ICD-10-CM

## 2018-04-19 DIAGNOSIS — T1491XA Suicide attempt, initial encounter: Secondary | ICD-10-CM

## 2018-04-19 DIAGNOSIS — Z91410 Personal history of adult physical and sexual abuse: Secondary | ICD-10-CM

## 2018-04-19 MED ORDER — GABAPENTIN 300 MG PO CAPS: 300.0000 mg | ORAL_CAPSULE | Freq: Three times a day (TID) | ORAL | 0 refills | Status: DC

## 2018-04-19 MED ORDER — AMOXICILLIN-POT CLAVULANATE 500-125 MG PO TABS: 1.0000 | ORAL_TABLET | Freq: Three times a day (TID) | ORAL | 0 refills | Status: DC

## 2018-04-19 MED ORDER — ACAMPROSATE CALCIUM 333 MG PO TBEC: 666.0000 mg | DELAYED_RELEASE_TABLET | Freq: Three times a day (TID) | ORAL | 0 refills | Status: DC

## 2018-04-19 MED ORDER — TRAZODONE HCL 50 MG PO TABS: 50.0000 mg | ORAL_TABLET | Freq: Every day | ORAL | Status: DC

## 2018-04-19 MED ORDER — ATENOLOL 25 MG PO TABS: 12.5000 mg | ORAL_TABLET | Freq: Two times a day (BID) | ORAL | 0 refills | Status: DC

## 2018-04-19 MED ORDER — TRAZODONE HCL 50 MG PO TABS: 50.0000 mg | ORAL_TABLET | Freq: Every evening | ORAL | 0 refills | Status: DC | PRN

## 2018-04-19 NOTE — [facility] Group Notes (Signed)
[facility] Group Notes: (Clinical Social Work)   04/19/2018      Type of Therapy:  Group Therapy   Participation Level:  Did Not Attend despite MHT prompting   Ambrose Mantle, LCSW 04/19/2018, 12:51 PM

## 2018-04-19 NOTE — Progress Notes (Signed)
Patient ID: Megan Solis, female   DOB: May 11, 1976, 42 y.o.   MRN: [id number]  Pt currently presents with a flat affect and anxious behavior. Pt affect brighter. Pt reports to writer that their goal is to "go home, stay sober." Pt states "I don't know how to live sober." Pt reports good sleep with current medication regimen.   Pt provided with medications per providers orders. Pt's labs and vitals were monitored throughout the night. Pt given a 1:1 about emotional and mental status. Pt supported and encouraged to express concerns and questions. Pt educated on medications.  Pt's safety ensured with 15 minute and environmental checks. Pt currently denies SI/HI and A/V hallucinations. Pt verbally agrees to seek staff if SI/HI or A/VH occurs and to consult with staff before acting on any harmful thoughts. Will continue POC.

## 2018-04-19 NOTE — Progress Notes (Signed)
D: Pt A & O X 4. Denies SI, HI, AVH and pain at this time. Presents fidgety and anxious but animated on interactions "I wish I can go home today if possible". Cooperative with care. Reports she's slept well last night with good appetite. D/C home as ordered. Picked up in lobby by her boyfriend.  A: D/C instructions reviewed with pt including prescriptions, medication samples and follow up appointment; compliance encouraged. All belongings from locker # 46 given to pt at time of departure. Scheduled medications given with verbal education and effects monitored. Safety checks maintained without incident till time of d/c.  R: Pt receptive to care. Compliant with medications when offered. Denies adverse drug reactions when assessed. Verbalized understanding related to d/c instructions. Signed belonging sheet in agreement with items received from locker. Ambulatory with a steady gait. Appears to be in no physical distress at time of departure.

## 2018-04-19 NOTE — [facility] Suicide Risk Assessment (Signed)
[facility] Discharge Suicide Risk Assessment   Principal Problem: <principal problem not specified> Discharge Diagnoses:  Patient Active Problem List   Diagnosis Date Noted  . MDD (major depressive disorder), recurrent severe, without psychosis (HCC) [F33.2] 04/16/2018  . MDD (major depressive disorder), single episode, moderate (HCC) [F32.1]   . Atypical pneumonia [J18.9] 04/08/2018  . Sepsis (HCC) [A41.9] 04/07/2018  . Pneumonia [J18.9] 04/07/2018  . Alcohol withdrawal (HCC) [F10.239] 04/07/2018  . History of seizure [Z87.898] 04/07/2018  . Tobacco abuse [Z72.0] 04/07/2018    Total Time spent with patient: 30 minutes  Musculoskeletal: Strength & Muscle Tone: within normal limits Gait & Station: normal Patient leans: N/A  Psychiatric Specialty Exam: Review of Systems  All other systems reviewed and are negative.   Blood pressure 125/75, pulse 96, temperature (!) 97.4 F (36.3 C), temperature source Oral, resp. rate 16, height 5' 1.5" (1.562 m), weight 72.6 kg (160 lb), last menstrual period 04/04/2018.Body mass index is 29.74 kg/m.  General Appearance: Casual  Eye Contact::  Fair  Speech:  Normal Rate409  Volume:  Normal  Mood:  Euthymic  Affect:  Appropriate  Thought Process:  Coherent  Orientation:  Full (Time, Place, and Person)  Thought Content:  Logical  Suicidal Thoughts:  No  Homicidal Thoughts:  No  Memory:  Immediate;   Fair  Judgement:  Fair  Insight:  Fair  Psychomotor Activity:  Normal  Concentration:  Fair  Recall:  [employer] of Knowledge:Fair  Language: Fair  Akathisia:  No  Handed:  Right  AIMS (if indicated):     Assets:  Desire for Improvement Resilience Social Support  Sleep:  Number of Hours: 5  Cognition: WNL  ADL's:  Intact   Mental Status Per Nursing Assessment::   On Admission:  Self-harm thoughts  Demographic Factors:  Divorced or widowed, Caucasian and Low socioeconomic status  Loss Factors: NA  Historical  Factors: Impulsivity  Risk Reduction Factors:   Living with another person, especially a relative  Continued Clinical Symptoms:  Alcohol/Substance Abuse/Dependencies  Cognitive Features That Contribute To Risk:  None    Suicide Risk:  Minimal: No identifiable suicidal ideation.  Patients presenting with no risk factors but with morbid ruminations; may be classified as minimal risk based on the severity of the depressive symptoms  Follow-up Information    [facility]. Go on 04/24/2018.   Specialty:  Behavioral Health Why:  Appointment is 04/24/18 at 8:00am. Please be sure to bring your Photo ID, SSN, any insurance information, and any discharge paperwork from the hospital. Contact information: [address] [place] [address] [phone]           Plan Of Care/Follow-up recommendations:  Activity:  ad lib  Antonieta Pert, MD 04/19/2018, 7:54 AM

## 2018-04-19 NOTE — Progress Notes (Signed)
  [facility] Adult Case Management Discharge Plan :  Will you be returning to the same living situation after discharge:  Yes,  home with roommates/boyfriend At discharge, do you have transportation home?: Yes,  arranged by patient Do you have the ability to pay for your medications: Yes,  patient denied barriers  Release of information consent forms completed and turned in to Medical Records by CSW.   Patient to Follow up at: Follow-up Information    [facility]. Go on 04/24/2018.   Specialty:  Behavioral Health Why:  Appointment is 04/24/18 at 8:00am. Please be sure to bring your Photo ID, SSN, any insurance information, and any discharge paperwork from the hospital. Contact information: [address] [place] [address] [phone]           Next level of care provider has access to [facility]:no  Safety Planning and Suicide Prevention discussed: Yes,  with friend  Have you used any form of tobacco in the last 30 days? (Cigarettes, Smokeless Tobacco, Cigars, and/or Pipes): Yes  Has patient been referred to the Quitline?: Patient refused referral  Patient has been referred for addiction treatment: Yes, assessment with [facility] to be done, refused rehab  Lynnell Chad, LCSW 04/19/2018, 9:27 AM
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
## 2018-05-08 ENCOUNTER — Encounter (HOSPITAL_COMMUNITY): Payer: Self-pay

## 2018-05-08 ENCOUNTER — Emergency Department (HOSPITAL_COMMUNITY)
Admission: EM | Admit: 2018-05-08 | Discharge: 2018-05-09 | Disposition: A | Discharge status: Home / Self Care | Payer: Self-pay | Attending: Emergency Medicine | Admitting: Emergency Medicine

## 2018-05-08 ENCOUNTER — Other Ambulatory Visit: Payer: Self-pay

## 2018-05-08 DIAGNOSIS — F1014 Alcohol abuse with alcohol-induced mood disorder: Secondary | ICD-10-CM | POA: Insufficient documentation

## 2018-05-08 DIAGNOSIS — F10920 Alcohol use, unspecified with intoxication, uncomplicated: Secondary | ICD-10-CM

## 2018-05-08 DIAGNOSIS — R45851 Suicidal ideations: Secondary | ICD-10-CM | POA: Insufficient documentation

## 2018-05-08 DIAGNOSIS — Z79899 Other long term (current) drug therapy: Secondary | ICD-10-CM | POA: Insufficient documentation

## 2018-05-08 DIAGNOSIS — F17210 Nicotine dependence, cigarettes, uncomplicated: Secondary | ICD-10-CM | POA: Insufficient documentation

## 2018-05-08 LAB — CBC
HCT: 37.5 % (ref 36.0–46.0)
HEMOGLOBIN: 12.5 g/dL (ref 12.0–15.0)
MCH: 29.3 pg (ref 26.0–34.0)
MCHC: 33.3 g/dL (ref 30.0–36.0)
MCV: 87.8 fL (ref 78.0–100.0)
PLATELETS: 430 10*3/uL — AB (ref 150–400)
RBC: 4.27 MIL/uL (ref 3.87–5.11)
RDW: 16.8 % — ABNORMAL HIGH (ref 11.5–15.5)
WBC: 6.3 10*3/uL (ref 4.0–10.5)

## 2018-05-08 LAB — COMPREHENSIVE METABOLIC PANEL
ALBUMIN: 4.5 g/dL (ref 3.5–5.0)
ALT: 29 U/L (ref 14–54)
AST: 32 U/L (ref 15–41)
Alkaline Phosphatase: 53 U/L (ref 38–126)
Anion gap: 12 (ref 5–15)
BUN: 12 mg/dL (ref 6–20)
CHLORIDE: 105 mmol/L (ref 101–111)
CO2: 23 mmol/L (ref 22–32)
Calcium: 9.4 mg/dL (ref 8.9–10.3)
Creatinine, Ser: 0.83 mg/dL (ref 0.44–1.00)
GFR calc Af Amer: >60 mL/min (ref >60)
GFR calc non Af Amer: >60 mL/min (ref >60)
GLUCOSE: 122 mg/dL — AB (ref 65–99)
POTASSIUM: 3.5 mmol/L (ref 3.5–5.1)
SODIUM: 140 mmol/L (ref 135–145)
Total Bilirubin: 0.8 mg/dL (ref 0.3–1.2)
Total Protein: 7.9 g/dL (ref 6.5–8.1)

## 2018-05-08 LAB — RAPID URINE DRUG SCREEN, HOSP PERFORMED
AMPHETAMINES: NOT DETECTED
BENZODIAZEPINES: NOT DETECTED
Barbiturates: NOT DETECTED
Cocaine: NOT DETECTED
OPIATES: NOT DETECTED
TETRAHYDROCANNABINOL: NOT DETECTED

## 2018-05-08 LAB — ETHANOL: Alcohol, Ethyl (B): 213 mg/dL — ABNORMAL HIGH (ref <10)

## 2018-05-08 LAB — ACETAMINOPHEN LEVEL: Acetaminophen (Tylenol), Serum: <10 ug/mL — ABNORMAL LOW (ref 10–30)

## 2018-05-08 LAB — I-STAT BETA HCG BLOOD, ED (MC, WL, AP ONLY): I-stat hCG, quantitative: <5 m[IU]/mL (ref <5)

## 2018-05-08 LAB — SALICYLATE LEVEL: Salicylate Lvl: <7 mg/dL (ref 2.8–30.0)

## 2018-05-08 MED ORDER — THIAMINE HCL 100 MG/ML IJ SOLN: 100.0000 mg | Freq: Every day | INTRAMUSCULAR | Status: DC

## 2018-05-08 MED ORDER — NICOTINE 21 MG/24HR TD PT24
21.0000 mg | MEDICATED_PATCH | Freq: Once | TRANSDERMAL | Status: DC
  Administered 2018-05-08: 21 mg via TRANSDERMAL
  Filled 2018-05-08: qty 1

## 2018-05-08 MED ORDER — VITAMIN B-1 100 MG PO TABS
100.0000 mg | ORAL_TABLET | Freq: Every day | ORAL | Status: DC
  Administered 2018-05-08 – 2018-05-09 (×2): 100 mg via ORAL
  Filled 2018-05-08 (×2): qty 1

## 2018-05-08 MED ORDER — ACETAMINOPHEN 500 MG PO TABS
1000.0000 mg | ORAL_TABLET | Freq: Once | ORAL | Status: AC
  Administered 2018-05-08: 1000 mg via ORAL
  Filled 2018-05-08: qty 2

## 2018-05-08 MED ORDER — LORAZEPAM 2 MG/ML IJ SOLN: 0.0000 mg | Freq: Two times a day (BID) | INTRAMUSCULAR | Status: DC

## 2018-05-08 MED ORDER — ALBUTEROL SULFATE HFA 108 (90 BASE) MCG/ACT IN AERS
2.0000 | INHALATION_SPRAY | Freq: Once | RESPIRATORY_TRACT | Status: AC
Start: 2018-05-08 — End: 2018-05-08
  Administered 2018-05-08: 2 via RESPIRATORY_TRACT
  Filled 2018-05-08: qty 6.7

## 2018-05-08 MED ORDER — LORAZEPAM 1 MG PO TABS
0.0000 mg | ORAL_TABLET | Freq: Four times a day (QID) | ORAL | Status: DC
  Administered 2018-05-08 – 2018-05-09 (×3): 1 mg via ORAL
  Filled 2018-05-08 (×3): qty 1

## 2018-05-08 MED ORDER — LORAZEPAM 2 MG/ML IJ SOLN: 0.0000 mg | Freq: Four times a day (QID) | INTRAMUSCULAR | Status: DC

## 2018-05-08 MED ORDER — LORAZEPAM 1 MG PO TABS: 0.0000 mg | ORAL_TABLET | Freq: Two times a day (BID) | ORAL | Status: DC

## 2018-05-08 NOTE — ED Notes (Signed)
Pt cussing at staff and yelling. Pt is very hesitant to cooperate without choice words. Security on stand by as Pt is
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
## 2018-05-08 NOTE — ED Provider Notes (Signed)
[facility]-EMERGENCY DEPT Provider Note   CSN: [id number] Arrival date & time: 05/08/18  1910     History   Chief Complaint Chief Complaint  Patient presents with  . Alcohol Intoxication  . Suicidal    HPI Megan Solis is a 42 y.o. female.  42 yo F with a cc of suicidal ideation.  The patient states that she has had enough of life and is with kill herself.  Was recently in the [facility] behavioral health system and has not taken her medication since then.  When asked why she just cannot shrug her shoulders and said she did not feel like it.  She has been trying to slit her wrist with her finger all day.  Says she has been on a drinking binge for the past 4 days.  Denies homicidal ideation.  The history is provided by the patient.  Alcohol Intoxication  This is a new problem. The current episode started 12 to 24 hours ago. The problem occurs constantly. The problem has not changed since onset.Pertinent negatives include no chest pain, no headaches and no shortness of breath. Nothing aggravates the symptoms. Nothing relieves the symptoms. She has tried nothing for the symptoms. The treatment provided no relief.    Past Medical History:  Diagnosis Date  . Abscess of right breast   . Alcohol abuse   . Pneumonia   . Seizures (HCC)    only when coming off alcohol  . Tobacco abuse     Patient Active Problem List   Diagnosis Date Noted  . 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 MDD (major depressive disorder), single episode, moderate (HCC)   . Atypical pneumonia 04/08/2018  . Sepsis (HCC) 04/07/2018  . Pneumonia 04/07/2018  . Alcohol withdrawal (HCC) 04/07/2018  . History of seizure 04/07/2018  . Tobacco abuse 04/07/2018    Past Surgical History:  Procedure Laterality Date  . INCISION AND DRAINAGE Right    R breast I&D     OB History   None      Home Medications    Prior to Admission  medications   Medication Sig Start Date End Date Taking? Authorizing Provider  diphenhydramine-acetaminophen (TYLENOL PM) 25-500 MG TABS tablet Take 2 tablets by mouth at bedtime as needed (headache pain).   Yes [provider]  acamprosate (CAMPRAL) 333 MG tablet Take 2 tablets (666 mg total) by mouth 3 (three) times daily with meals. For cravings 04/19/18   Money, Gerlene Burdock, FNP  amoxicillin-clavulanate (AUGMENTIN) 500-125 MG tablet Take 1 tablet (500 mg total) by mouth 3 (three) times daily. Take until comoplete 04/19/18   Money, Gerlene Burdock, FNP  atenolol (TENORMIN) 25 MG tablet Take 0.5 tablets (12.5 mg total) by mouth 2 (two) times daily. 04/19/18   Money, Gerlene Burdock, FNP  gabapentin (NEURONTIN) 300 MG capsule Take 1 capsule (300 mg total) by mouth 3 (three) times daily. For withdrawal symptoms 04/19/18   Money, Gerlene Burdock, FNP  traZODone (DESYREL) 50 MG tablet Take 1 tablet (50 mg total) by mouth at bedtime as needed for sleep. 04/19/18   Money, Gerlene Burdock, FNP    Family History Family History  Problem Relation Age of Onset  . Cancer Father     Social History Social History   Tobacco Use  . Smoking status: Current Every Day Smoker    Packs/day: 1.00    Types: Cigarettes  . Smokeless tobacco: Never Used  Substance Use Topics  .  Alcohol use: Yes    Comment: 1/5 liquor daily  . Drug use: Not Currently     Allergies   Patient has no known allergies.   Review of Systems Review of Systems  Constitutional: Negative for chills and fever.  HENT: Negative for congestion and rhinorrhea.   Eyes: Negative for redness and visual disturbance.  Respiratory: Negative for shortness of breath and wheezing.   Cardiovascular: Negative for chest pain and palpitations.  Gastrointestinal: Negative for nausea and vomiting.  Genitourinary: Negative for dysuria and urgency.  Musculoskeletal: Negative for arthralgias and myalgias.  Skin: Negative for pallor and wound.  Neurological: Negative for  dizziness and headaches.  Psychiatric/Behavioral: Positive for self-injury and suicidal ideas.     Physical Exam Updated Vital Signs BP (!) 112/98 (BP Location: Left Arm)   Pulse 97   Temp 98.2 F (36.8 C) (Oral)   Resp 18   Ht  (1.651 m)   Wt 68 kg (150 lb)   SpO2 99%   BMI 24.96 kg/m   Physical Exam  Constitutional: She is oriented to person, place, and time. She appears well-developed and well-nourished. No distress.  HENT:  Head: Normocephalic and atraumatic.  Eyes: Pupils are equal, round, and reactive to light. EOM are normal.  Neck: Normal range of motion. Neck supple.  Cardiovascular: Normal rate and regular rhythm. Exam reveals no gallop and no friction rub.  No murmur heard. Pulmonary/Chest: Effort normal. She has no wheezes. She has no rales.  Abdominal: Soft. She exhibits no distension. There is no tenderness.  Musculoskeletal: She exhibits no edema or tenderness.  Superficial scratches to the right wrist  Neurological: She is alert and oriented to person, place, and time.  Skin: Skin is warm and dry. She is not diaphoretic.  Psychiatric: She has a normal mood and affect. Her behavior is normal. She expresses suicidal ideation. She expresses no homicidal ideation. She expresses suicidal plans. She expresses no homicidal plans.  Nursing note and vitals reviewed.    ED Treatments / Results  Labs (all labs ordered are listed, but only abnormal results are displayed) Labs Reviewed  COMPREHENSIVE METABOLIC PANEL - Abnormal; Notable for the following components:      Result Value   Glucose, Bld 122 (*)    All other components within normal limits  ETHANOL - Abnormal; Notable for the following components:   Alcohol, Ethyl (B) 213 (*)    All other components within normal limits  ACETAMINOPHEN LEVEL - Abnormal; Notable for the following components:   Acetaminophen (Tylenol), Serum <10 (*)    All other components within normal limits  CBC - Abnormal; Notable  for the following components:   RDW 16.8 (*)    Platelets 430 (*)    All other components within normal limits  SALICYLATE LEVEL  RAPID URINE DRUG SCREEN, HOSP PERFORMED  I-STAT BETA HCG BLOOD, ED ([facility], [facility], [facility] ONLY)    EKG None  Radiology No results found.  Procedures Procedures (including critical care time)  Medications Ordered in ED Medications  LORazepam (ATIVAN) injection 0-4 mg ( Intravenous See Alternative 05/08/18 2214)    Or  LORazepam (ATIVAN) tablet 0-4 mg (1 mg Oral Given 05/08/18 2214)  LORazepam (ATIVAN) injection 0-4 mg (has no administration in time range)    Or  LORazepam (ATIVAN) tablet 0-4 mg (has no administration in time range)  thiamine (VITAMIN B-1) tablet 100 mg (100 mg Oral Given 05/08/18 2158)    Or  thiamine (B-1) injection 100 mg (  Intravenous See Alternative 05/08/18 2158)  nicotine (NICODERM CQ - dosed in mg/24 hours) patch 21 mg (21 mg Transdermal Patch Applied 05/08/18 2214)  acetaminophen (TYLENOL) tablet 1,000 mg (1,000 mg Oral Given 05/08/18 2158)  albuterol (PROVENTIL HFA;VENTOLIN HFA) 108 (90 Base) MCG/ACT inhaler 2 puff (2 puffs Inhalation Given 05/08/18 2200)     Initial Impression / Assessment and Plan / ED Course  I have reviewed the triage vital signs and the nursing notes.  Pertinent labs & imaging results that were available during my care of the patient were reviewed by me and considered in my medical decision making (see chart for details).     42 yo F with a chief complaint of suicidal ideation.  Patient states that she is trying to kill herself by slitting her wrist.  She tried to use her finger which she feels is very sharp.  TTS evaluation.  I feel she is medically clear.  The patients results and plan were reviewed and discussed.   Any x-rays performed were independently reviewed by myself.   Differential diagnosis were considered with the presenting HPI.  Medications  LORazepam (ATIVAN) injection 0-4 mg ( Intravenous See  Alternative 05/08/18 2214)    Or  LORazepam (ATIVAN) tablet 0-4 mg (1 mg Oral Given 05/08/18 2214)  LORazepam (ATIVAN) injection 0-4 mg (has no administration in time range)    Or  LORazepam (ATIVAN) tablet 0-4 mg (has no administration in time range)  thiamine (VITAMIN B-1) tablet 100 mg (100 mg Oral Given 05/08/18 2158)    Or  thiamine (B-1) injection 100 mg ( Intravenous See Alternative 05/08/18 2158)  nicotine (NICODERM CQ - dosed in mg/24 hours) patch 21 mg (21 mg Transdermal Patch Applied 05/08/18 2214)  acetaminophen (TYLENOL) tablet 1,000 mg (1,000 mg Oral Given 05/08/18 2158)  albuterol (PROVENTIL HFA;VENTOLIN HFA) 108 (90 Base) MCG/ACT inhaler 2 puff (2 puffs Inhalation Given 05/08/18 2200)    Vitals:   05/08/18 1924 05/08/18 1927 05/08/18 2143  BP: (!) 158/108 (!) 132/104 (!) 112/98  Pulse: (!) 103 (!) 102 97  Resp: Temp: (!) 97.2 F (36.2 C) (!) 97.5 F (36.4 C) 98.2 F (36.8 C)  TempSrc: Axillary Oral Oral  SpO2: 99% 97% 99%  Weight: 68 kg (150 lb)    Height:  (1.651 m)      Final diagnoses:  Alcoholic intoxication without complication (HCC)  Suicidal ideations      Final Clinical Impressions(s) / ED Diagnoses   Final diagnoses:  Alcoholic intoxication without complication [facility])  Suicidal ideations    ED Discharge Orders    None       Melene Plan, DO 05/08/18 2250

## 2018-05-08 NOTE — ED Notes (Signed)
Bed: [facility] Expected date:  Expected time:  Means of arrival:  Comments: EMS 42 yo female SI/ETOH

## 2018-05-08 NOTE — ED Notes (Addendum)
Pt stated "I'm a drunk.  I've f------ been drinking for a week.  I have 2 court dates coming up.  One in [place] and 1 in [place]."

## 2018-05-08 NOTE — ED Notes (Signed)
TTS in progress 

## 2018-05-08 NOTE — ED Triage Notes (Signed)
Per EMS: Pt coming from home c/o of suicidal ideation and reports "Drinking vodka every day for one week and vomiting blood."

## 2018-05-08 NOTE — ED Notes (Signed)
Pt changed into scrubs and all items placed in bag.

## 2018-05-09 ENCOUNTER — Emergency Department (HOSPITAL_COMMUNITY)
Admission: EM | Admit: 2018-05-09 | Discharge: 2018-05-10 | Disposition: A | Discharge status: Home / Self Care | Payer: Self-pay | Attending: Emergency Medicine | Admitting: Emergency Medicine

## 2018-05-09 DIAGNOSIS — F10920 Alcohol use, unspecified with intoxication, uncomplicated: Secondary | ICD-10-CM

## 2018-05-09 DIAGNOSIS — T50902A Poisoning by unspecified drugs, medicaments and biological substances, intentional self-harm, initial encounter: Secondary | ICD-10-CM | POA: Insufficient documentation

## 2018-05-09 DIAGNOSIS — Y908 Blood alcohol level of 240 mg/100 ml or more: Secondary | ICD-10-CM | POA: Insufficient documentation

## 2018-05-09 DIAGNOSIS — Z79899 Other long term (current) drug therapy: Secondary | ICD-10-CM | POA: Insufficient documentation

## 2018-05-09 DIAGNOSIS — F17210 Nicotine dependence, cigarettes, uncomplicated: Secondary | ICD-10-CM | POA: Insufficient documentation

## 2018-05-09 DIAGNOSIS — F1014 Alcohol abuse with alcohol-induced mood disorder: Secondary | ICD-10-CM | POA: Diagnosis present

## 2018-05-09 LAB — CBC
HCT: 35.9 % — ABNORMAL LOW (ref 36.0–46.0)
HEMOGLOBIN: 12.1 g/dL (ref 12.0–15.0)
MCH: 30 pg (ref 26.0–34.0)
MCHC: 33.7 g/dL (ref 30.0–36.0)
MCV: 89.1 fL (ref 78.0–100.0)
PLATELETS: 391 10*3/uL (ref 150–400)
RBC: 4.03 MIL/uL (ref 3.87–5.11)
RDW: 16.7 % — ABNORMAL HIGH (ref 11.5–15.5)
WBC: 9.9 10*3/uL (ref 4.0–10.5)

## 2018-05-09 LAB — COMPREHENSIVE METABOLIC PANEL
ALT: 24 U/L (ref 14–54)
AST: 32 U/L (ref 15–41)
Albumin: 4.3 g/dL (ref 3.5–5.0)
Alkaline Phosphatase: 49 U/L (ref 38–126)
Anion gap: 12 (ref 5–15)
BUN: 8 mg/dL (ref 6–20)
CHLORIDE: 104 mmol/L (ref 101–111)
CO2: 20 mmol/L — AB (ref 22–32)
CREATININE: 0.7 mg/dL (ref 0.44–1.00)
Calcium: 9.1 mg/dL (ref 8.9–10.3)
GFR calc non Af Amer: >60 mL/min (ref >60)
Glucose, Bld: 117 mg/dL — ABNORMAL HIGH (ref 65–99)
POTASSIUM: 3.4 mmol/L — AB (ref 3.5–5.1)
SODIUM: 136 mmol/L (ref 135–145)
Total Bilirubin: 1.2 mg/dL (ref 0.3–1.2)
Total Protein: 7.8 g/dL (ref 6.5–8.1)

## 2018-05-09 LAB — ETHANOL: Alcohol, Ethyl (B): 309 mg/dL (ref <10)

## 2018-05-09 LAB — ACETAMINOPHEN LEVEL: Acetaminophen (Tylenol), Serum: 14 ug/mL (ref 10–30)

## 2018-05-09 LAB — SALICYLATE LEVEL

## 2018-05-09 MED ORDER — OLANZAPINE 10 MG IM SOLR: 10.0000 mg | Freq: Once | INTRAMUSCULAR | Status: DC

## 2018-05-09 MED ORDER — IBUPROFEN 200 MG PO TABS
400.0000 mg | ORAL_TABLET | Freq: Once | ORAL | Status: AC
  Administered 2018-05-09: 400 mg via ORAL
  Filled 2018-05-09: qty 2

## 2018-05-09 MED ORDER — BACITRACIN ZINC 500 UNIT/GM EX OINT
TOPICAL_OINTMENT | Freq: Every day | CUTANEOUS | Status: DC
  Administered 2018-05-09: 1 via TOPICAL
  Filled 2018-05-09: qty 0.9

## 2018-05-09 NOTE — ED Notes (Signed)
TTS AT BEDSIDE 

## 2018-05-09 NOTE — ED Notes (Addendum)
[facility] SUPPORT GAVE RESOURCES TO PT BEFORE DISCHARGE. NO RX GIVEN. DRESSING SUPPLIES GIVEN.

## 2018-05-09 NOTE — [facility] Suicide Risk Assessment (Signed)
Suicide Risk Assessment  Discharge Assessment   [facility] Discharge Suicide Risk Assessment   Principal Problem: Alcohol abuse with alcohol-induced mood disorder [facility]) Discharge Diagnoses:  Patient Active Problem List   Diagnosis Date Noted  . Alcohol abuse with alcohol-induced mood disorder (HCC) [F10.14] 05/09/2018    Priority: High  . MDD (major depressive disorder), single episode, moderate (HCC) [F32.1]   . Atypical pneumonia [J18.9] 04/08/2018  . Sepsis (HCC) [A41.9] 04/07/2018  . Pneumonia [J18.9] 04/07/2018  . Alcohol withdrawal (HCC) [F10.239] 04/07/2018  . History of seizure [Z87.898] 04/07/2018  . Tobacco abuse [Z72.0] 04/07/2018    Total Time spent with patient: 45 minutes  Musculoskeletal: Strength & Muscle Tone: within normal limits Gait & Station: normal Patient leans: N/A  Psychiatric Specialty Exam:   Blood pressure (!) 142/99, pulse 88, temperature 98.6 F (37 C), temperature source Oral, resp. rate 18, height  (1.651 m), weight 68 kg (150 lb), SpO2 99 %.Body mass index is 24.96 kg/m.  General Appearance: Casual  Eye Contact::  Good  Speech:  Normal Rate409  Volume:  Normal  Mood:  Euthymic  Affect:  Congruent  Thought Process:  Coherent and Descriptions of Associations: Intact  Orientation:  Full (Time, Place, and Person)  Thought Content:  WDL and Logical  Suicidal Thoughts:  No  Homicidal Thoughts:  No  Memory:  Immediate;   Good Recent;   Good Remote;   Good  Judgement:  Fair  Insight:  Fair  Psychomotor Activity:  Normal  Concentration:  Fair  Recall:  Fair  Fund of Knowledge:Fair  Language: Good  Akathisia:  No  Handed:  Right  AIMS (if indicated):     Assets:  Housing Leisure Time Physical Health Resilience Social Support  Sleep:     Cognition: WNL  ADL's:  Intact   Mental Status Per Nursing Assessment::   On Admission:   42 yo female who presented to the ED under the influence of alcohol with suicidal ideations.  On  assessment, she is clear and coherent with no suicidal/homicidal ideations, hallucinations, or withdrawal symptoms.  She still has a gabapentin Rx from her last visit that she never filled.  Peer support consult placed but not interested in rehab or recovery at this time, stable for discharge.  Demographic Factors:  Caucasian  Loss Factors: NA  Historical Factors: NA  Risk Reduction Factors:   Sense of responsibility to family, Living with another person, especially a relative and Positive social support  Continued Clinical Symptoms:  None  Cognitive Features That Contribute To Risk:  None    Suicide Risk:  Minimal: No identifiable suicidal ideation.  Patients presenting with no risk factors but with morbid ruminations; may be classified as minimal risk based on the severity of the depressive symptoms    Plan Of Care/Follow-up recommendations:  Activity:  as tolerated Diet:  heart healthy diet  Lolah Coghlan, NP 05/09/2018, 10:51 AM

## 2018-05-09 NOTE — ED Notes (Addendum)
PT STATES HER RIDE WILL BE HERE AT NOON. AWARE SHE IS READY FOR DISCHARGE. NO RX GIVEN

## 2018-05-09 NOTE — ED Provider Notes (Signed)
[facility]-EMERGENCY DEPT Provider Note   CSN: [id number] Arrival date & time: 05/09/18  2141     History   Chief Complaint No chief complaint on file.   HPI Megan Solis is a 42 y.o. female.  42 yo F with a chief complaint of intentional drug overdose.  The patient told EMS that she took approximately 20 atenolol tablets.  She then took a handful of other assorted pills.  She drank a bit and alcohol today.  She was recently in the emergency department yesterday with suicidality.  History is limited due to intoxication.  Level 5 caveat.  The history is provided by the patient.  Illness  This is a recurrent problem. The current episode started 1 to 2 hours ago. The problem occurs constantly. The problem has not changed since onset.Pertinent negatives include no chest pain, no headaches and no shortness of breath. Nothing aggravates the symptoms. Nothing relieves the symptoms. She has tried nothing for the symptoms. The treatment provided no relief.    Past Medical History:  Diagnosis Date  . Abscess of right breast   . Alcohol abuse   . Pneumonia   . Seizures (HCC)    only when coming off alcohol  . Tobacco abuse     Patient Active Problem List   Diagnosis Date Noted  . Alcohol abuse with alcohol-induced mood disorder (HCC) 05/09/2018  . MDD (major depressive disorder), single episode, moderate (HCC)   . Atypical pneumonia 04/08/2018  . Sepsis (HCC) 04/07/2018  . Pneumonia 04/07/2018  . Alcohol withdrawal (HCC) 04/07/2018  . History of seizure 04/07/2018  . Tobacco abuse 04/07/2018    Past Surgical History:  Procedure Laterality Date  . INCISION AND DRAINAGE Right    R breast I&D     OB History   None      Home Medications    Prior to Admission medications   Medication Sig Start Date End Date Taking? Authorizing Provider  amoxicillin-clavulanate (AUGMENTIN) 500-125 MG tablet Take 1 tablet (500 mg total) by mouth 3 (three) times  daily. Take until comoplete 04/19/18  Yes Money, Gerlene Burdock, FNP  atenolol (TENORMIN) 25 MG tablet Take 0.5 tablets (12.5 mg total) by mouth 2 (two) times daily. 04/19/18  Yes Money, Gerlene Burdock, FNP  acamprosate (CAMPRAL) 333 MG tablet Take 2 tablets (666 mg total) by mouth 3 (three) times daily with meals. For cravings 04/19/18   Money, Gerlene Burdock, FNP  gabapentin (NEURONTIN) 300 MG capsule Take 1 capsule (300 mg total) by mouth 3 (three) times daily. For withdrawal symptoms 05/10/18   Charm Rings, NP  traZODone (DESYREL) 50 MG tablet Take 1 tablet (50 mg total) by mouth at bedtime as needed for sleep. 04/19/18   Money, Gerlene Burdock, FNP    Family History Family History  Problem Relation Age of Onset  . Cancer Father     Social History Social History   Tobacco Use  . Smoking status: Current Every Day Smoker    Packs/day: 1.00    Types: Cigarettes  . Smokeless tobacco: Never Used  Substance Use Topics  . Alcohol use: Yes    Comment: 1/5 liquor daily  . Drug use: Not Currently     Allergies   Patient has no known allergies.   Review of Systems Review of Systems  Unable to perform ROS: Mental status change  Constitutional: Negative for chills and fever.  HENT: Negative for congestion and rhinorrhea.   Eyes: Negative for redness and visual  disturbance.  Respiratory: Negative for shortness of breath and wheezing.   Cardiovascular: Negative for chest pain and palpitations.  Gastrointestinal: Negative for nausea and vomiting.  Genitourinary: Negative for dysuria and urgency.  Musculoskeletal: Negative for arthralgias and myalgias.  Skin: Negative for pallor and wound.  Neurological: Negative for dizziness and headaches.  Psychiatric/Behavioral: Positive for suicidal ideas.     Physical Exam Updated Vital Signs BP (!) 147/99 (BP Location: Left Arm)   Pulse 81   Temp 98 F (36.7 C) (Oral)   Resp 18   Ht  (1.651 m)   Wt 68 kg (150 lb)   SpO2 98%   BMI 24.96 kg/m    Physical Exam  Constitutional: She appears well-developed and well-nourished. No distress.  Clinically intoxicated.  HENT:  Head: Normocephalic and atraumatic.  Eyes: Pupils are equal, round, and reactive to light. EOM are normal.  Neck: Normal range of motion. Neck supple.  Cardiovascular: Normal rate and regular rhythm. Exam reveals no gallop and no friction rub.  No murmur heard. Pulmonary/Chest: Effort normal. She has no wheezes. She has no rales.  Abdominal: Soft. She exhibits no distension. There is no tenderness.  Musculoskeletal: She exhibits no edema or tenderness.  Neurological: She is alert.  Skin: Skin is warm and dry. She is not diaphoretic.  Psychiatric: She has a normal mood and affect. Her behavior is normal.  Nursing note and vitals reviewed.    ED Treatments / Results  Labs (all labs ordered are listed, but only abnormal results are displayed) Labs Reviewed  COMPREHENSIVE METABOLIC PANEL - Abnormal; Notable for the following components:      Result Value   Potassium 3.4 (*)    CO2 20 (*)    Glucose, Bld 117 (*)    All other components within normal limits  ETHANOL - Abnormal; Notable for the following components:   Alcohol, Ethyl (B) 309 (*)    All other components within normal limits  CBC - Abnormal; Notable for the following components:   HCT 35.9 (*)    RDW 16.7 (*)    All other components within normal limits  ACETAMINOPHEN LEVEL - Abnormal; Notable for the following components:   Acetaminophen (Tylenol), Serum <10 (*)    All other components within normal limits  BASIC METABOLIC PANEL - Abnormal; Notable for the following components:   Glucose, Bld 244 (*)    Calcium 8.8 (*)    All other components within normal limits  CBG MONITORING, ED - Abnormal; Notable for the following components:   Glucose-Capillary 130 (*)    All other components within normal limits  SALICYLATE LEVEL  ACETAMINOPHEN LEVEL  RAPID URINE DRUG SCREEN, HOSP PERFORMED   SALICYLATE LEVEL  I-STAT BETA HCG BLOOD, ED ([facility], [facility], [facility] ONLY)    EKG EKG Interpretation  Date/Time:  Saturday May 10 2018 01:27:04 EDT Ventricular Rate:  86 PR Interval:    QRS Duration: 93 QT Interval:  387 QTC Calculation: 463 R Axis:   54 Text Interpretation:  Sinus rhythm Borderline low voltage, extremity leads No significant change was found Confirmed by Glynn Octave [phone]) on 05/10/2018 1:30:13 AM Also confirmed by Glynn Octave [phone]), editor Barbette Hair [phone])  on 05/10/2018 8:07:39 AM   Radiology No results found.  Procedures Procedures (including critical care time)  Medications Ordered in ED Medications  glucagon (human recombinant) (GLUCAGEN) injection 1 mg (1 mg Intravenous Given 05/10/18 0118)     Initial Impression / Assessment and Plan / ED Course  I have reviewed the triage vital signs and the nursing notes.  Pertinent labs & imaging results that were available during my care of the patient were reviewed by me and considered in my medical decision making (see chart for details).     42 yo F with a chief complaint of intentional drug overdose.  Patient was seen here yesterday with suicidality and was discharged home.  She took a handful of pills.  Difficulty discerning exactly what she took.  The only concerning thing is atenolol.  Discussed with poison control recommended a 6-hour observation.  The patient was aggressive with staff and there is some concern for harm to herself and others but improved with de escalation.    Reassessed without bradycardia or hypotension.  Continue to obs until 6 hours period.   Turned over to Dr. Manus Gunning, please see his note for further details.  The patients results and plan were reviewed and discussed.   Any x-rays performed were independently reviewed by myself.   Differential diagnosis were considered with the presenting HPI.  Medications  glucagon (human recombinant) (GLUCAGEN) injection 1 mg (1 mg  Intravenous Given 05/10/18 0118)    Vitals:   05/10/18 0208 05/10/18 0440 05/10/18 0459 05/10/18 1104  BP: 112/80 (!) 124/96  (!) 147/99  Pulse: 98 75  81  Resp: Temp: 97.6 F (36.4 C) 98.1 F (36.7 C)  98 F (36.7 C)  TempSrc: Oral Oral  Oral  SpO2: 100% 100%  98%  Weight:   68 kg (150 lb)   Height:    (1.651 m)     Final diagnoses:  Intentional drug overdose, initial encounter (HCC)  Alcoholic intoxication without complication (HCC)  Alcohol abuse with alcohol-induced mood disorder (HCC)       Final Clinical Impressions(s) / ED Diagnoses   Final diagnoses:  Intentional drug overdose, initial encounter (HCC)  Alcoholic intoxication without complication (HCC)  Alcohol abuse with alcohol-induced mood disorder [facility])    ED Discharge Orders        Ordered    gabapentin (NEURONTIN) 300 MG capsule  3 times daily     05/10/18 1053    Increase activity slowly     05/10/18 1053    Diet - low sodium heart healthy     05/10/18 1053    Discharge instructions    Comments:  Follow-up with outpatient resources provided   05/10/18 1053       Melene Plan, [place] 05/11/18 2300

## 2018-05-09 NOTE — ED Provider Notes (Signed)
Care assumed from Dr. Adela Lank.  Patient with ingestion concern for possible atenolol ingestion but she is not bradycardic or hypotensive.  She is intoxicated.  Poison control recommending 6-hour observation which will be about 3 AM.  Recheck 6 AM.  Patient is awake and alert.  She is answering questions.  She admits that she took some ibuprofen last night and perhaps Tylenol PM.  She thinks she may take an "5 or 6" of her atenolol as she did not have that many left.  She denies any chest pain or shortness of breath.  EKG is sinus rhythm with stable vital signs.  Low suspicion for significant beta-blocker overdose at this time.  Discussed with poison center who agrees.  Patient with reassuring EKG and vital signs.  No bradycardia or hypotension.  He has been observed greater than 6 hours now and is medically clear from  potential beta-blocker overdose.  Consult TTS.   Glynn Octave, MD 05/10/18 [phone]

## 2018-05-09 NOTE — ED Triage Notes (Signed)
Pt comes from home, si, OD (  20 pills for atenolol  each taken, 6 amoxicillin pills  each, and other pills unknown.  V/s on arrival bp 148/100, hr 84, rr16, spo2 96, cbg 142.  Pt verbalizes SI. Vocal and upset state of mind, active vomiting.  Pt has ETOH.

## 2018-05-09 NOTE — Patient Outreach (Signed)
ED Peer Support Specialist Patient Intake (Complete at intake & 30-60 Day Follow-up)  Name: Megan Solis  MRN: [id number]  Age: 42 y.o.   Date of Admission: 05/09/2018  Intake: Initial Comments:      Primary Reason Admitted: SI, depression, alcohol use   Lab values: Alcohol/ETOH: Positive Positive UDS? No Amphetamines: No Barbiturates: No Benzodiazepines: No Cocaine: No Opiates: No Cannabinoids: No  Demographic information: Gender: Female Ethnicity: White Marital Status: Single Insurance Status: Uninsured/Self-pay Ec[employer] (Work Neurosurgeon, Physicist, medical, etc.: No Lives with: Friend/Rommate Living situation: House/Apartment  Reported Patient History: Patient reported health conditions: Depression, Other (comment)(History of seizures ) Patient aware of HIV and hepatitis status: Yes (comment)(Negative)  In past year, has patient visited ED for any reason? Yes  Number of ED visits: 2  Reason(s) for visit: pneumonia, alcohol use  In past year, has patient been hospitalized for any reason? Yes  Number of hospitalizations: 1  Reason(s) for hospitalization: Patient was at [facility] for alcohol use, SI  In past year, has patient been arrested? No  Number of arrests:    Reason(s) for arrest:    In past year, has patient been incarcerated? No  Number of incarcerations:    Reason(s) for incarceration:    In past year, has patient received medication-assisted treatment? No  In past year, patient received the following treatments:    In past year, has patient received any harm reduction services? No  Did this include any of the following?    In past year, has patient received care from a mental health provider for diagnosis other than SUD? Yes([facility] Discharged on 04/19/18)  In past year, is this first time patient has overdosed? (has not overdosed)  Number of past overdoses:    In past year, is this first time patient has been  hospitalized for an overdose? (has not overdosed )  Number of hospitalizations for overdose(s):    Is patient currently receiving treatment for a mental health diagnosis? No  Patient reports experiencing difficulty participating in SUD treatment: Yes    Most important reason(s) for this difficulty? Treatment access, Cost or financial reasons, Other (comment)(Patient has upcoming court dates on 5/24 in [facility] for DUI and missed a court date on 5/20)  Has patient received prior services for treatment? Yes  In past, patient has received services from following agencies: Other (comment)([facility] in April 2019)  Plan of Care:  Suggested follow up at these agencies/treatment centers: (Patient is interested in Residential substance use treatment. [facility] provided resources and explained what residential treatment options are available to her. )  Other information: [facility] met with the patient and provided substance use recovery support. CPSS provided information for [facility] and [facility] for their residential substance use treatment services. [facility] explained how to get connected with each resource and encouraged the patient to contact [facility] for further help with these resources after discharge from the hospital. CPSS also provided [facility] contact information, list of outpatient treatment centers with information for ADS, and AA meeting list. CPSS highly encouraged the patient to follow up with [facility] to help her get into a residential substance use treatment center.    Mason Jim, C[facility]  05/09/2018 11:41 AM

## 2018-05-09 NOTE — ED Notes (Signed)
Pt refused CBG x2. States that she does not like needles or "pricky" things.

## 2018-05-09 NOTE — ED Notes (Signed)
Unable to obtain CBG-patient is combative and cursing at staff

## 2018-05-09 NOTE — BH Assessment (Addendum)
[facility] Assessment Progress Note  Per Jacqueline Norman, DO, this pt does not require psychiatric hospitalization at this time.  Pt is to be discharged from [facility] with recommendation to follow up with [facility].  This has been included in pt's discharge instructions.  Pt would also benefit from seeing Peer Support Specialists; they will be asked to speak to pt.  Pt's nurse, Ashley, has been notified.  Megan Jerger, MA Triage Specialist [phone]     

## 2018-05-09 NOTE — ED Notes (Signed)
ED Provider at bedside. ALLEN EVALUATING RT BREAST. THIS WRITER AT BEDSIDE

## 2018-05-09 NOTE — Discharge Instructions (Signed)
For your mental health needs, you are advised to follow up with [facility].  New and returning patients are seen at their walk-in clinic.  Walk-in hours are Monday - Friday from 8:00 am - 3:00 pm.  Walk-in patients are seen on a first come, first served basis.  Try to arrive as early as possible for he best chance of being seen the same day: ° °     [facility] °     [address] °     [place],  [address] °     [phone] °

## 2018-05-09 NOTE — ED Notes (Signed)
Called PC  Recommendations given to provider when pt first arrived  -------- Recommend 6 hr observation  Possible over night evaluation  Monitor for bradycardia --- atropine  Monitor for hypertension--- fluids   basic blood panels  ekg as needed  Monitor for refractory symptoms  Fax number provider to [facility] and will fax over more info  Provider notified of these findings at 21:45

## 2018-05-10 ENCOUNTER — Other Ambulatory Visit: Payer: Self-pay

## 2018-05-10 LAB — BASIC METABOLIC PANEL
ANION GAP: 13 (ref 5–15)
BUN: 8 mg/dL (ref 6–20)
CO2: 22 mmol/L (ref 22–32)
Calcium: 8.8 mg/dL — ABNORMAL LOW (ref 8.9–10.3)
Chloride: 105 mmol/L (ref 101–111)
Creatinine, Ser: 0.79 mg/dL (ref 0.44–1.00)
GFR calc Af Amer: >60 mL/min (ref >60)
GLUCOSE: 244 mg/dL — AB (ref 65–99)
POTASSIUM: 3.6 mmol/L (ref 3.5–5.1)
Sodium: 140 mmol/L (ref 135–145)

## 2018-05-10 LAB — RAPID URINE DRUG SCREEN, HOSP PERFORMED
Amphetamines: NOT DETECTED
BARBITURATES: NOT DETECTED
Benzodiazepines: NOT DETECTED
Cocaine: NOT DETECTED
OPIATES: NOT DETECTED
TETRAHYDROCANNABINOL: NOT DETECTED

## 2018-05-10 LAB — I-STAT BETA HCG BLOOD, ED (MC, WL, AP ONLY): I-stat hCG, quantitative: <5 m[IU]/mL (ref <5)

## 2018-05-10 LAB — SALICYLATE LEVEL

## 2018-05-10 LAB — ACETAMINOPHEN LEVEL: Acetaminophen (Tylenol), Serum: <10 ug/mL — ABNORMAL LOW (ref 10–30)

## 2018-05-10 LAB — CBG MONITORING, ED: Glucose-Capillary: 130 mg/dL — ABNORMAL HIGH (ref 65–99)

## 2018-05-10 MED ORDER — GLUCAGON HCL RDNA (DIAGNOSTIC) 1 MG IJ SOLR
1.0000 mg | Freq: Once | INTRAMUSCULAR | Status: AC
  Administered 2018-05-10: 1 mg via INTRAVENOUS
  Filled 2018-05-10: qty 1

## 2018-05-10 MED ORDER — GABAPENTIN 300 MG PO CAPS: 300.0000 mg | ORAL_CAPSULE | Freq: Three times a day (TID) | ORAL | 0 refills | Status: DC

## 2018-05-10 NOTE — ED Notes (Signed)
Patient has been aggressive with staff/cursing staff since arrival to ED-patient is not compliant with keeping monitor/NIBP cuff on or pulse ox-repeatedly having to replace all monitoring equipment. Patient given water and crackers and took the top off her cup and poured the water onto the floor-water cup removed-patient assisted to the bathroom and took gloves and filled them with water and was drinking water after the gloves. Patient poured the water from the gloves on to the bed-complete bed linen change

## 2018-05-10 NOTE — [facility] Suicide Risk Assessment (Signed)
Suicide Risk Assessment  Discharge Assessment   [facility] Discharge Suicide Risk Assessment   Principal Problem: Alcohol abuse with alcohol-induced mood disorder [facility]) Discharge Diagnoses:  Patient Active Problem List   Diagnosis Date Noted  . Alcohol abuse with alcohol-induced mood disorder (HCC) [F10.14] 05/09/2018    Priority: High  . MDD (major depressive disorder), single episode, moderate (HCC) [F32.1]   . Atypical pneumonia [J18.9] 04/08/2018  . Sepsis (HCC) [A41.9] 04/07/2018  . Pneumonia [J18.9] 04/07/2018  . Alcohol withdrawal (HCC) [F10.239] 04/07/2018  . History of seizure [Z87.898] 04/07/2018  . Tobacco abuse [Z72.0] 04/07/2018    Total Time spent with patient: 45 minutes  Musculoskeletal: Strength & Muscle Tone: within normal limits Gait & Station: normal Patient leans: N/A  Psychiatric Specialty Exam:   Blood pressure (!) 124/96, pulse 75, temperature 98.1 F (36.7 C), temperature source Oral, resp. rate 18, height  (1.651 m), weight 68 kg (150 lb), SpO2 100 %.Body mass index is 24.96 kg/m.  General Appearance: Casual  Eye Contact::  Good  Speech:  Normal Rate409  Volume:  Normal  Mood:  Euthymic  Affect:  Congruent  Thought Process:  Coherent and Descriptions of Associations: Intact  Orientation:  Full (Time, Place, and Person)  Thought Content:  WDL and Logical  Suicidal Thoughts:  No  Homicidal Thoughts:  No  Memory:  Immediate;   Good Recent;   Good Remote;   Good  Judgement:  Fair  Insight:  Fair  Psychomotor Activity:  Normal  Concentration:  Good  Recall:  Good  Fund of Knowledge:Fair  Language: Good  Akathisia:  No  Handed:  Right  AIMS (if indicated):     Assets:  Housing Leisure Time Physical Health Resilience Social Support  Sleep:     Cognition: WNL  ADL's:  Intact   Mental Status Per Nursing Assessment::   On Admission:   42 yo female who was drinking heavily and stated she was having suicidal ideations.  Today on  assessment, she denies suicidal/homicidal ideations, hallucinations, and withdrawal symptoms.  Gabapentin Rx provided with encouragement to use to prevent withdrawal symptoms.  Peer support consult placed, she has court on Monday and wants to leave, stable for discharge.  Demographic Factors:  Caucasian  Loss Factors: Legal issues  Historical Factors: NA  Risk Reduction Factors:   Sense of responsibility to family, Living with another person, especially a relative and Positive social support  Continued Clinical Symptoms:  None   Cognitive Features That Contribute To Risk:  None    Suicide Risk:  Minimal: No identifiable suicidal ideation.  Patients presenting with no risk factors but with morbid ruminations; may be classified as minimal risk based on the severity of the depressive symptoms    Plan Of Care/Follow-up recommendations:  Activity:  as tolerated Diet:  heart healthy diet  Lucienne Sawyers, NP 05/10/2018, 10:53 AM

## 2018-05-10 NOTE — ED Notes (Signed)
Pt contracted for safety verbally.

## 2018-05-10 NOTE — ED Notes (Signed)
Bed: WA31 Expected date:  Expected time:  Means of arrival:  Comments: 

## 2018-05-10 NOTE — Patient Outreach (Signed)
[facility] met with Pt and made Pt aware that she had spoke with [facility] yesterday and was forgot to address that Pt has court on Monday. CPSS explained that Pt needs to resolve the court situations before she maybe able to get into a treatment facility. CPSS left contact information for Pt to get in touch with [facility] if she need assistance in the community,

## 2018-05-10 NOTE — ED Notes (Signed)
Pt given portable phone and phone numbers from her purse

## 2018-05-10 NOTE — ED Notes (Signed)
Pt ambulatory to the bathroom w/o assistance  
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
## 2018-05-10 NOTE — ED Notes (Signed)
TTS at bedside. 

## 2018-05-10 NOTE — BHH Counselor (Signed)
PerDr. Elsie Saas, MD,  this pt does not require psychiatric hospitalization at this time.Pt is to be discharged from [facility] with recommendation to be seen by Peer Support.This has been included in pt's discharge instructions.

## 2018-05-10 NOTE — ED Notes (Signed)
Peer support at beside

## 2018-05-10 NOTE — ED Notes (Signed)
Pt refused to be re-connected to the monitor.

## 2018-06-07 ENCOUNTER — Emergency Department (HOSPITAL_COMMUNITY)
Admission: EM | Admit: 2018-06-07 | Discharge: 2018-06-07 | Disposition: A | Discharge status: Home / Self Care | Payer: Self-pay | Attending: Emergency Medicine | Admitting: Emergency Medicine

## 2018-06-07 ENCOUNTER — Encounter (HOSPITAL_COMMUNITY): Payer: Self-pay | Admitting: Nurse Practitioner

## 2018-06-07 DIAGNOSIS — N644 Mastodynia: Secondary | ICD-10-CM | POA: Insufficient documentation

## 2018-06-07 DIAGNOSIS — Z5321 Procedure and treatment not carried out due to patient leaving prior to being seen by health care provider: Secondary | ICD-10-CM | POA: Insufficient documentation

## 2018-06-07 NOTE — ED Notes (Signed)
Unable to complete the rest of screening question due to pt's uncooperativeness and probable alcohol induced agitation.

## 2018-06-07 NOTE — ED Triage Notes (Signed)
Pt is c/o right breast pain that she reports she "has been told she has MRSA" she adds that she "drank a little bit of alcohol to help the pain."

## 2018-06-07 NOTE — ED Notes (Signed)
Called patient and no answer.

## 2018-06-07 NOTE — ED Notes (Signed)
Called patient to room and no answer. 

## 2018-06-07 NOTE — ED Notes (Signed)
Called patient to a room and no answer. 

## 2019-01-09 ENCOUNTER — Emergency Department
Admission: EM | Admit: 2019-01-09 | Discharge: 2019-01-09 | Disposition: A | Discharge status: Home / Self Care | Payer: Self-pay | Attending: Emergency Medicine | Admitting: Emergency Medicine

## 2019-01-09 DIAGNOSIS — F17210 Nicotine dependence, cigarettes, uncomplicated: Secondary | ICD-10-CM | POA: Insufficient documentation

## 2019-01-09 DIAGNOSIS — L03011 Cellulitis of right finger: Secondary | ICD-10-CM | POA: Insufficient documentation

## 2019-01-09 DIAGNOSIS — Z79899 Other long term (current) drug therapy: Secondary | ICD-10-CM | POA: Insufficient documentation

## 2019-01-09 DIAGNOSIS — I10 Essential (primary) hypertension: Secondary | ICD-10-CM | POA: Insufficient documentation

## 2019-01-09 DIAGNOSIS — W57XXXA Bitten or stung by nonvenomous insect and other nonvenomous arthropods, initial encounter: Secondary | ICD-10-CM | POA: Insufficient documentation

## 2019-01-09 HISTORY — DX: Methicillin resistant Staphylococcus aureus infection, unspecified site: A49.02

## 2019-01-09 HISTORY — DX: Essential (primary) hypertension: I10

## 2019-01-09 MED ORDER — DOXYCYCLINE HYCLATE 100 MG PO CAPS: 100.0000 mg | ORAL_CAPSULE | Freq: Two times a day (BID) | ORAL | 0 refills | Status: DC

## 2019-01-09 MED ORDER — DOXYCYCLINE HYCLATE 100 MG PO TABS
100.0000 mg | ORAL_TABLET | Freq: Once | ORAL | Status: AC
  Administered 2019-01-09: 100 mg via ORAL
  Filled 2019-01-09: qty 1

## 2019-01-09 NOTE — ED Triage Notes (Signed)
Patient reports she awoke yesterday with bite to right index finger. Patient reports increased pain/swelling since.

## 2019-01-09 NOTE — ED Provider Notes (Signed)
[facility] Emergency Department Provider Note  ___________________________________________   First MD Initiated Contact with Patient 01/09/19 0700     (approximate)  I have reviewed the triage vital signs and the nursing notes.   HISTORY  Chief Complaint Insect Bite   HPI Megan Solis is a 43 y.o. female with a history of alcohol abuse and depression who was presented emergency department with a "spider bite" to her right index finger.  She states that she woke up with a cut on her finger yesterday morning.  She says that since then this area has gotten more red, hard and painful.  Says that it also draining a clear fluid this morning.  No fever reported.  No allergies to medication.  Patient reports a history of high blood pressure.  Says that she is able to move and feel her finger.   Past Medical History:  Diagnosis Date  . Abscess of right breast   . Alcohol abuse   . Hypertension   . MRSA (methicillin resistant Staphylococcus aureus)   . Pneumonia   . Seizures (HCC)    only when coming off alcohol  . Tobacco abuse     Patient Active Problem List   Diagnosis Date Noted  . Alcohol abuse with alcohol-induced mood disorder (HCC) 05/09/2018  . MDD (major depressive disorder), single episode, moderate (HCC)   . Atypical pneumonia 04/08/2018  . Sepsis (HCC) 04/07/2018  . Pneumonia 04/07/2018  . Alcohol withdrawal (HCC) 04/07/2018  . History of seizure 04/07/2018  . Tobacco abuse 04/07/2018    Past Surgical History:  Procedure Laterality Date  . INCISION AND DRAINAGE Right    R breast I&D    Prior to Admission medications   Medication Sig Start Date End Date Taking? Authorizing Provider  acamprosate (CAMPRAL) 333 MG tablet Take 2 tablets (666 mg total) by mouth 3 (three) times daily with meals. For cravings 04/19/18   Money, Gerlene Burdock, FNP  amoxicillin-clavulanate (AUGMENTIN) 500-125 MG tablet Take 1 tablet (500 mg total) by mouth 3  (three) times daily. Take until comoplete 04/19/18   Money, Gerlene Burdock, FNP  atenolol (TENORMIN) 25 MG tablet Take 0.5 tablets (12.5 mg total) by mouth 2 (two) times daily. 04/19/18   Money, Gerlene Burdock, FNP  gabapentin (NEURONTIN) 300 MG capsule Take 1 capsule (300 mg total) by mouth 3 (three) times daily. For withdrawal symptoms 05/10/18   Charm Rings, NP  traZODone (DESYREL) 50 MG tablet Take 1 tablet (50 mg total) by mouth at bedtime as needed for sleep. 04/19/18   Money, Gerlene Burdock, FNP    Allergies Patient has no known allergies.  Family History  Problem Relation Age of Onset  . Cancer Father     Social History Social History   Tobacco Use  . Smoking status: Current Every Day Smoker    Packs/day: 1.00    Types: Cigarettes  . Smokeless tobacco: Never Used  Substance Use Topics  . Alcohol use: Yes    Comment: 1/5 liquor daily  . Drug use: Not Currently    Review of Systems  Constitutional: No fever/chills Eyes: No visual changes. ENT: No sore throat. Cardiovascular: Denies chest pain. Respiratory: Denies shortness of breath. Gastrointestinal: No abdominal pain.  No nausea, no vomiting.  No diarrhea.  No constipation. Genitourinary: Negative for dysuria. Musculoskeletal: Negative for back pain. Skin: Negative for rash. Neurological: Negative for headaches, focal weakness or numbness.   ____________________________________________   PHYSICAL EXAM:  VITAL SIGNS: ED Triage  Vitals [01/09/19 0614]  Enc Vitals Group     BP (!) 168/105     Pulse Rate 87     Resp 15     Temp 98.1 F (36.7 C)     Temp Source Oral     SpO2 99 %     Weight      Height 5\' 4"  (1.626 m)     Head Circumference      Peak Flow      Pain Score 5     Pain Loc      Pain Edu?      Excl. in GC?     Constitutional: Alert and oriented. Well appearing and in no acute distress. Eyes: Conjunctivae are normal.  Head: Atraumatic. Nose: No congestion/rhinnorhea. Mouth/Throat: Mucous membranes are  moist.  Neck: No stridor.   Cardiovascular: Normal rate, regular rhythm. Grossly normal heart sounds.   Respiratory: Normal respiratory effort.  No retractions. Lungs CTAB. Gastrointestinal: Soft and nontender. No distention.  Musculoskeletal: No lower extremity tenderness nor edema.  No joint effusions.  Right lateral index finger with what appears to be an abrasion approximately 3 x 2 mm to the proximal phalangeal segment.  There is approximately 3 will surrounding induration and erythema with minimal tenderness to palpation.  No exudate.  Patient is neurovascular intact.  Compartments are soft.  No tenderness along the flexor sheath.  No fluctuance palpated.  Neurologic:  Normal speech and language. No gross focal neurologic deficits are appreciated. Skin:  Skin is warm, dry and intact. No rash noted. Psychiatric: Mood and affect are normal. Speech and behavior are normal.  ____________________________________________   LABS (all labs ordered are listed, but only abnormal results are displayed)  Labs Reviewed - No data to display ____________________________________________  EKG   ____________________________________________  RADIOLOGY   ____________________________________________   PROCEDURES  Procedure(s) performed:   Procedures  Critical Care performed:   ____________________________________________   INITIAL IMPRESSION / ASSESSMENT AND PLAN / ED COURSE  Pertinent labs & imaging results that were available during my care of the patient were reviewed by me and considered in my medical decision making (see chart for details).  DDX: Cellulitis, abrasion, insect bite, abscess As part of my medical decision making, I reviewed the following data within the electronic MEDICAL RECORD NUMBER Notes from prior ED visits  Patient appears to have a mild case of cellulitis.  Unclear cause but because of worsening symptoms and drainage I will prescribe antibiotics.  Patient will  be discharged with doxycycline.  Patient understanding the diagnosis was treated plan and willing to comply. ____________________________________________   FINAL CLINICAL IMPRESSION(S) / ED DIAGNOSES  Cellulitis of the right index finger.  NEW MEDICATIONS STARTED DURING THIS VISIT:  New Prescriptions   No medications on file     Note:  This document was prepared using Dragon voice recognition software and may include unintentional dictation errors.     Myrna Blazer, MD 01/09/19 [phone]

## 2019-03-11 ENCOUNTER — Observation Stay
Admission: EM | Admit: 2019-03-11 | Discharge: 2019-03-13 | Disposition: A | Discharge status: Home / Self Care | Payer: Self-pay | Attending: Internal Medicine | Admitting: Internal Medicine

## 2019-03-11 ENCOUNTER — Encounter: Payer: Self-pay | Admitting: *Deleted

## 2019-03-11 ENCOUNTER — Emergency Department: Payer: Self-pay

## 2019-03-11 ENCOUNTER — Other Ambulatory Visit: Payer: Self-pay

## 2019-03-11 DIAGNOSIS — E872 Acidosis, unspecified: Secondary | ICD-10-CM | POA: Diagnosis present

## 2019-03-11 DIAGNOSIS — F1010 Alcohol abuse, uncomplicated: Secondary | ICD-10-CM | POA: Insufficient documentation

## 2019-03-11 DIAGNOSIS — Z8614 Personal history of Methicillin resistant Staphylococcus aureus infection: Secondary | ICD-10-CM | POA: Insufficient documentation

## 2019-03-11 DIAGNOSIS — F329 Major depressive disorder, single episode, unspecified: Secondary | ICD-10-CM | POA: Insufficient documentation

## 2019-03-11 DIAGNOSIS — F17210 Nicotine dependence, cigarettes, uncomplicated: Secondary | ICD-10-CM | POA: Insufficient documentation

## 2019-03-11 DIAGNOSIS — A419 Sepsis, unspecified organism: Principal | ICD-10-CM | POA: Insufficient documentation

## 2019-03-11 DIAGNOSIS — N611 Abscess of the breast and nipple: Secondary | ICD-10-CM | POA: Insufficient documentation

## 2019-03-11 DIAGNOSIS — I10 Essential (primary) hypertension: Secondary | ICD-10-CM | POA: Insufficient documentation

## 2019-03-11 DIAGNOSIS — E876 Hypokalemia: Secondary | ICD-10-CM | POA: Insufficient documentation

## 2019-03-11 DIAGNOSIS — L0291 Cutaneous abscess, unspecified: Secondary | ICD-10-CM

## 2019-03-11 DIAGNOSIS — K047 Periapical abscess without sinus: Secondary | ICD-10-CM | POA: Insufficient documentation

## 2019-03-11 DIAGNOSIS — Z79899 Other long term (current) drug therapy: Secondary | ICD-10-CM | POA: Insufficient documentation

## 2019-03-11 LAB — CBC WITH DIFFERENTIAL/PLATELET
Abs Immature Granulocytes: 0.02 10*3/uL (ref 0.00–0.07)
BASOS ABS: 0.1 10*3/uL (ref 0.0–0.1)
BASOS PCT: 1 %
EOS PCT: 1 %
Eosinophils Absolute: 0.1 10*3/uL (ref 0.0–0.5)
HCT: 42.9 % (ref 36.0–46.0)
Hemoglobin: 14.1 g/dL (ref 12.0–15.0)
Immature Granulocytes: 0 %
Lymphocytes Relative: 41 %
Lymphs Abs: 3.9 10*3/uL (ref 0.7–4.0)
MCH: 27.8 pg (ref 26.0–34.0)
MCHC: 32.9 g/dL (ref 30.0–36.0)
MCV: 84.4 fL (ref 80.0–100.0)
MONO ABS: 1.2 10*3/uL — AB (ref 0.1–1.0)
Monocytes Relative: 13 %
NRBC: 0 % (ref 0.0–0.2)
Neutro Abs: 4.3 10*3/uL (ref 1.7–7.7)
Neutrophils Relative %: 44 %
PLATELETS: 396 10*3/uL (ref 150–400)
RBC: 5.08 MIL/uL (ref 3.87–5.11)
RDW: 19.1 % — AB (ref 11.5–15.5)
WBC: 9.7 10*3/uL (ref 4.0–10.5)

## 2019-03-11 LAB — COMPREHENSIVE METABOLIC PANEL
ALBUMIN: 4.6 g/dL (ref 3.5–5.0)
ALK PHOS: 69 U/L (ref 38–126)
ALT: 25 U/L (ref 0–44)
ANION GAP: 14 (ref 5–15)
AST: 37 U/L (ref 15–41)
BILIRUBIN TOTAL: 1.6 mg/dL — AB (ref 0.3–1.2)
BUN: 17 mg/dL (ref 6–20)
CALCIUM: 9 mg/dL (ref 8.9–10.3)
CO2: 26 mmol/L (ref 22–32)
Chloride: 102 mmol/L (ref 98–111)
Creatinine, Ser: 0.81 mg/dL (ref 0.44–1.00)
GFR calc non Af Amer: >60 mL/min (ref >60)
Glucose, Bld: 143 mg/dL — ABNORMAL HIGH (ref 70–99)
POTASSIUM: 3.1 mmol/L — AB (ref 3.5–5.1)
SODIUM: 142 mmol/L (ref 135–145)
TOTAL PROTEIN: 7.4 g/dL (ref 6.5–8.1)

## 2019-03-11 LAB — LACTIC ACID, PLASMA
LACTIC ACID, VENOUS: 2.3 mmol/L — AB (ref 0.5–1.9)
Lactic Acid, Venous: 3.5 mmol/L (ref 0.5–1.9)

## 2019-03-11 MED ORDER — ONDANSETRON HCL 4 MG/2ML IJ SOLN
4.0000 mg | Freq: Once | INTRAMUSCULAR | Status: AC
  Administered 2019-03-11: 4 mg via INTRAVENOUS

## 2019-03-11 MED ORDER — ONDANSETRON HCL 4 MG/2ML IJ SOLN
INTRAMUSCULAR | Status: AC
  Administered 2019-03-11: 4 mg via INTRAVENOUS
  Filled 2019-03-11: qty 2

## 2019-03-11 MED ORDER — VANCOMYCIN HCL IN DEXTROSE 1-5 GM/200ML-% IV SOLN
1000.0000 mg | Freq: Once | INTRAVENOUS | Status: AC
  Administered 2019-03-11: 1000 mg via INTRAVENOUS
  Filled 2019-03-11: qty 200

## 2019-03-11 MED ORDER — SODIUM CHLORIDE 0.9 % IV BOLUS
1000.0000 mL | Freq: Once | INTRAVENOUS | Status: AC
  Administered 2019-03-11: 1000 mL via INTRAVENOUS

## 2019-03-11 MED ORDER — LIDOCAINE-EPINEPHRINE 2 %-1:100000 IJ SOLN
1.7000 mL | Freq: Once | INTRAMUSCULAR | Status: AC
  Administered 2019-03-11: 1.7 mL
  Filled 2019-03-11: qty 1.7

## 2019-03-11 NOTE — ED Triage Notes (Signed)
Pt has left side upper and lower toothache.  Pt also has abscess to right breast.  Pt alert  Speech clear.

## 2019-03-11 NOTE — ED Notes (Signed)
Date and time results received: 03/11/19 2259  Test: lactic acid  Critical Value: 2.3  Name of Provider Notified: Dr. Lenard Lance

## 2019-03-11 NOTE — ED Notes (Signed)
EDP made aware of critical Lab.

## 2019-03-11 NOTE — ED Provider Notes (Signed)
[facility] Emergency Department Provider Note  ____________________________________________  Time seen: Approximately 7:59 PM  I have reviewed the triage vital signs and the nursing notes.   HISTORY  Chief Complaint Dental Pain and Recurrent Skin Infections    HPI Megan Solis is a 43 y.o. female who presents the emergency department with 2 complaints.  Patient reports that she has had upper and lower dental infection ongoing for the past several days.  Patient is also complaining of right breast abscess.  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  Patient has had multiple surgical debridements.  She has been positive for MRSA and septic from previous infections.  Patient reports that she is having fevers and chills, body aches, nausea or vomiting.  She states that she has been unable to maintain good oral intake of fluids or solids over the past several days.  Patient does endorse a fever at home.  Patient denies any headache, vision changes, neck pain or stiffness, chest pain, shortness of breath.  She does have nausea, vomiting.  No abdominal pain or diarrhea.        Past Medical History:  Diagnosis Date  . Abscess of right breast   . Alcohol abuse   . Hypertension   . MRSA (methicillin resistant Staphylococcus aureus)   . Pneumonia   . Seizures (HCC)    only when coming off alcohol  . Tobacco abuse     Patient Active Problem List   Diagnosis Date Noted  . Alcohol abuse with alcohol-induced mood disorder (HCC) 05/09/2018  . MDD (major depressive disorder), single episode, moderate (HCC)   . Atypical pneumonia 04/08/2018  . Sepsis (HCC) 04/07/2018  . Pneumonia 04/07/2018  . Alcohol withdrawal (HCC) 04/07/2018  . History of seizure 04/07/2018  . Tobacco abuse 04/07/2018    Past Surgical History:  Procedure Laterality Date  . INCISION AND DRAINAGE Right    R breast I&D    Prior to Admission medications    Medication Sig Start Date End Date Taking? Authorizing Provider  acamprosate (CAMPRAL) 333 MG tablet Take 2 tablets (666 mg total) by mouth 3 (three) times daily with meals. For cravings 04/19/18   Money, Gerlene Burdock, FNP  amoxicillin-clavulanate (AUGMENTIN) 500-125 MG tablet Take 1 tablet (500 mg total) by mouth 3 (three) times daily. Take until comoplete 04/19/18   Money, Gerlene Burdock, FNP  atenolol (TENORMIN) 25 MG tablet Take 0.5 tablets (12.5 mg total) by mouth 2 (two) times daily. 04/19/18   Money, Gerlene Burdock, FNP  doxycycline (VIBRAMYCIN) 100 MG capsule Take 1 capsule (100 mg total) by mouth 2 (two) times daily. 01/09/19   Schaevitz, Myra Rude, MD  gabapentin (NEURONTIN) 300 MG capsule Take 1 capsule (300 mg total) by mouth 3 (three) times daily. For withdrawal symptoms 05/10/18   Charm Rings, NP  traZODone (DESYREL) 50 MG tablet Take 1 tablet (50 mg total) by mouth at bedtime as needed for sleep. 04/19/18   Money, Gerlene Burdock, FNP    Allergies Patient has no known allergies.  Family History  Problem Relation Age of Onset  . Cancer Father     Social History Social History   Tobacco Use  . Smoking status: Current Every Day Smoker    Packs/day: 1.00    Types: Cigarettes  . Smokeless tobacco: Never Used  Substance Use Topics  . Alcohol use: Yes    Comment: 1/5 liquor daily  . Drug use: Not Currently  Review of Systems  Constitutional: No fever/chills Eyes: No visual changes. No discharge ENT: Positive for left upper and lower dental pain. Cardiovascular: no chest pain. Respiratory: no cough. No SOB. Gastrointestinal: No abdominal pain.  No nausea, no vomiting.  No diarrhea.  No constipation. Genitourinary: Negative for dysuria. No hematuria Musculoskeletal: Negative for musculoskeletal pain. Skin: Positive for right breast abscess Neurological: Negative for headaches, focal weakness or numbness. 10-point ROS otherwise  negative.  ____________________________________________   PHYSICAL EXAM:  VITAL SIGNS: ED Triage Vitals  Enc Vitals Group     BP 03/11/19 1924 (!) 138/97     Pulse Rate 03/11/19 1924 96     Resp 03/11/19 1924 18     Temp 03/11/19 1924 98.9 F (37.2 C)     Temp Source 03/11/19 1924 Oral     SpO2 03/11/19 1924 99 %     Weight 03/11/19 1925 170 lb (77.1 kg)     Height 03/11/19 1925 5\' 4"  (1.626 m)     Head Circumference --      Peak Flow --      Pain Score 03/11/19 1925 8     Pain Loc --      Pain Edu? --      Excl. in GC? --      Constitutional: Alert and oriented. Well appearing and in no acute distress. Eyes: Conjunctivae are normal. PERRL. EOMI. Head: Atraumatic. ENT:      Ears:       Nose: No congestion/rhinnorhea.      Mouth/Throat: Mucous membranes are moist.  Visualization of the dentition reveals poor dentition throughout.  Multiple erosions, cavities and missing dentition.  Patient does have areas of erythema and edema to the outer aspect of the mandible and maxilla.  These occur in the left side.  No fluctuance with depression on tongue depressor.  No purulent drainage.  No indication of deep space infection to include Ludwick's angina or Lemierre's.  Uvula is midline. Neck: No stridor.  Neck is supple full range of motion Hematological/Lymphatic/Immunilogical: No right-sided axillary lymphadenopathy.  No cervical lymphadenopathy. Cardiovascular: Normal rate, regular rhythm. Normal S1 and S2.  Good peripheral circulation. Respiratory: Normal respiratory effort without tachypnea or retractions. Lungs CTAB. Good air entry to the bases with no decreased or absent breath sounds. Musculoskeletal: Full range of motion to all extremities. No gross deformities appreciated. Neurologic:  Normal speech and language. No gross focal neurologic deficits are appreciated.  Skin:  Skin is warm, dry and intact. No rash noted.  Visualization of the right breast reveals erythema, edema  to the right breast tissue adjacent to the area Ola.  This occurs in the 7 o'clock position.  Area is exquisitely tender to palpation.  Positive for both induration and fluctuance.  No drainage from the skin or nipple.  No other palpable findings to the breast tissue.  No other tenderness to palpation. Psychiatric: Mood and affect are normal. Speech and behavior are normal. Patient exhibits appropriate insight and judgement.   ____________________________________________   LABS (all labs ordered are listed, but only abnormal results are displayed)  Labs Reviewed  COMPREHENSIVE METABOLIC PANEL - Abnormal; Notable for the following components:      Result Value   Potassium 3.1 (*)    Glucose, Bld 143 (*)    Total Bilirubin 1.6 (*)    All other components within normal limits  LACTIC ACID, PLASMA - Abnormal; Notable for the following components:   Lactic Acid, Venous 3.5 (*)  All other components within normal limits  CBC WITH DIFFERENTIAL/PLATELET - Abnormal; Notable for the following components:   RDW 19.1 (*)    Monocytes Absolute 1.2 (*)    All other components within normal limits  CULTURE, BLOOD (ROUTINE X 2)  CULTURE, BLOOD (ROUTINE X 2)  LACTIC ACID, PLASMA  URINALYSIS, COMPLETE (UACMP) WITH MICROSCOPIC  URINE DRUG SCREEN, QUALITATIVE ([facility] ONLY)  POC URINE PREG, ED   ____________________________________________  EKG   ____________________________________________  RADIOLOGY I personally viewed and evaluated these images as part of my medical decision making, as well as reviewing the written report by the radiologist.  No results found.  ____________________________________________    PROCEDURES  Procedure(s) performed:    .Nerve Block Date/Time: 03/11/2019 9:32 PM Performed by: Racheal Patches, PA-C Authorized by: Racheal Patches, PA-C   Consent:    Consent obtained:  Verbal   Consent given by:  Patient   Risks discussed:  Unsuccessful  block Indications:    Indications:  Pain relief Location:    Body area:  Head   Head nerve blocked: Left inferior alveolar nerve.   Laterality:  Left Procedure details (see MAR for exact dosages):    Block needle gauge:  27 G   Anesthetic injected:  Lidocaine 1% WITH epi   Steroid injected:  None   Additive injected:  None   Injection procedure:  Anatomic landmarks identified, introduced needle, negative aspiration for blood and incremental injection Post-procedure details:    Dressing:  None   Outcome:  Pain relieved   Patient tolerance of procedure:  Tolerated well, no immediate complications .Nerve Block Date/Time: 03/11/2019 9:34 PM Performed by: Racheal Patches, PA-C Authorized by: Racheal Patches, PA-C   Consent:    Consent obtained:  Verbal   Consent given by:  Patient   Risks discussed:  Pain and unsuccessful block Indications:    Indications:  Pain relief Location:    Body area:  Head   Head nerve blocked: Middle superior alveolar nerve.   Laterality:  Left Procedure details (see MAR for exact dosages):    Block needle gauge:  27 G   Anesthetic injected:  Lidocaine 1% WITH epi   Steroid injected:  None   Additive injected:  None   Injection procedure:  Anatomic landmarks identified, introduced needle, negative aspiration for blood and incremental injection Post-procedure details:    Outcome:  Pain relieved   Patient tolerance of procedure:  Tolerated well, no immediate complications      Medications  lidocaine-EPINEPHrine (XYLOCAINE W/EPI) 2 %-1:100000 (with pres) injection 1.7 mL (has no administration in time range)  lidocaine-EPINEPHrine (XYLOCAINE W/EPI) 2 %-1:100000 (with pres) injection 1.7 mL (has no administration in time range)  vancomycin (VANCOCIN) IVPB 1000 mg/200 mL premix (1,000 mg Intravenous New Bag/Given 03/11/19 2156)  sodium chloride 0.9 % bolus 1,000 mL (0 mLs Intravenous Stopped 03/11/19 2148)  sodium chloride 0.9 % bolus 1,000  mL (1,000 mLs Intravenous New Bag/Given 03/11/19 2155)  ondansetron (ZOFRAN) injection 4 mg (4 mg Intravenous Given 03/11/19 2206)     ____________________________________________   INITIAL IMPRESSION / ASSESSMENT AND PLAN / ED COURSE  Pertinent labs & imaging results that were available during my care of the patient were reviewed by me and considered in my medical decision making (see chart for details).  Review of the [place] CSRS was performed in accordance of the [facility] prior to dispensing any controlled drugs.  Clinical Course as of Mar 11 2215  Wed Mar 11, 2019  2134 Patient presented to the emergency department with multiple complaints.  Patient was complaining of left-sided dental infection to both the upper and lower dentition.  She was also complaining of right-sided breast abscess.  Findings are consistent with dental infection to the left upper and lower dentition with no signs of deep space infection.  Patient is given nerve blocks in the emergency department for symptom relief.  On exam, patient does have findings consistent with abscess to the right breast.  She has a significant history of MRSA infections to the breast with multiple surgeries and previous sepsis from infection.  Patient reports significant nausea, vomiting over the past several days.  She is having difficulty maintaining good oral and solid intake.  Given findings, labs, imaging of the right breast was undertaken.  Patient has an elevated lactic of 3.5.  Patient is mildly hypokalemic but otherwise labs are mostly reassuring at this time.  Patient will be started on antibiotics empirically with vancomycin and a second liter of fluid.  Pending further results at this time.   [JC]    Clinical Course User Index [JC] , Delorise Royals, PA-C         Patient presented to the emergency department with complaint of dental pain and breast abscess.  On exam, patient had findings with consistent with left-sided dental  infection, right side breast abscess.  Patient was given dental block for dental pain with good relief.  Patient's labs returned with an elevated lactic of 3.5.  White blood cell count was still reassuring patient was mildly hypokalemic.  Otherwise labs were reassuring.  Given elevated lactate, patient was transferred to the major side of the emergency department under sepsis criteria.  I suspect however that this is likely due to dehydration from repeated emesis.  Patient is getting 2 L of IV fluid and patient will be started on vancomycin empirically.  Patient remainder of her labs and imaging are still pending at this time.  Patient care transferred to attending provider, Dr. Lenard Lance.  Final diagnosis and disposition by attending provider.     This chart was dictated using voice recognition software/Dragon. Despite best efforts to proofread, errors can occur which can change the meaning. Any change was purely unintentional.    Racheal Patches, PA-C 03/11/19 2216    Phineas Semen, MD 03/11/19 2242

## 2019-03-12 ENCOUNTER — Other Ambulatory Visit: Payer: Self-pay

## 2019-03-12 DIAGNOSIS — E872 Acidosis, unspecified: Secondary | ICD-10-CM | POA: Diagnosis present

## 2019-03-12 DIAGNOSIS — N611 Abscess of the breast and nipple: Secondary | ICD-10-CM

## 2019-03-12 LAB — URINALYSIS, COMPLETE (UACMP) WITH MICROSCOPIC
Bacteria, UA: NONE SEEN
Bilirubin Urine: NEGATIVE
Glucose, UA: NEGATIVE mg/dL
Ketones, ur: NEGATIVE mg/dL
Leukocytes,Ua: NEGATIVE
Nitrite: NEGATIVE
Protein, ur: NEGATIVE mg/dL
Specific Gravity, Urine: 1.028 (ref 1.005–1.030)
WBC, UA: NONE SEEN WBC/hpf (ref 0–5)
pH: 5 (ref 5.0–8.0)

## 2019-03-12 LAB — URINE DRUG SCREEN, QUALITATIVE (ARMC ONLY)
Amphetamines, Ur Screen: NOT DETECTED
Barbiturates, Ur Screen: NOT DETECTED
Benzodiazepine, Ur Scrn: POSITIVE — AB
Cannabinoid 50 Ng, Ur ~~LOC~~: POSITIVE — AB
Cocaine Metabolite,Ur ~~LOC~~: NOT DETECTED
MDMA (Ecstasy)Ur Screen: NOT DETECTED
Methadone Scn, Ur: NOT DETECTED
Opiate, Ur Screen: NOT DETECTED
Phencyclidine (PCP) Ur S: NOT DETECTED
Tricyclic, Ur Screen: NOT DETECTED

## 2019-03-12 LAB — HEMOGLOBIN A1C
Hgb A1c MFr Bld: 5.1 % (ref 4.8–5.6)
Mean Plasma Glucose: 99.67 mg/dL

## 2019-03-12 LAB — MRSA PCR SCREENING: MRSA by PCR: NEGATIVE

## 2019-03-12 LAB — TSH: TSH: 1.159 u[IU]/mL (ref 0.350–4.500)

## 2019-03-12 MED ORDER — ONDANSETRON HCL 4 MG/2ML IJ SOLN: 4.0000 mg | Freq: Four times a day (QID) | INTRAMUSCULAR | Status: DC | PRN

## 2019-03-12 MED ORDER — SODIUM CHLORIDE 0.9 % IV SOLN
INTRAVENOUS | Status: DC
  Administered 2019-03-12: 04:00:00 via INTRAVENOUS

## 2019-03-12 MED ORDER — VANCOMYCIN HCL IN DEXTROSE 1-5 GM/200ML-% IV SOLN
1000.0000 mg | Freq: Two times a day (BID) | INTRAVENOUS | Status: DC
  Administered 2019-03-12 – 2019-03-13 (×2): 1000 mg via INTRAVENOUS
  Filled 2019-03-12 (×3): qty 200

## 2019-03-12 MED ORDER — ONDANSETRON HCL 4 MG PO TABS: 4.0000 mg | ORAL_TABLET | Freq: Four times a day (QID) | ORAL | Status: DC | PRN

## 2019-03-12 MED ORDER — NICOTINE 21 MG/24HR TD PT24
21.0000 mg | MEDICATED_PATCH | Freq: Every day | TRANSDERMAL | Status: DC
  Administered 2019-03-12 – 2019-03-13 (×2): 21 mg via TRANSDERMAL
  Filled 2019-03-12 (×2): qty 1

## 2019-03-12 MED ORDER — ENOXAPARIN SODIUM 40 MG/0.4ML ~~LOC~~ SOLN
40.0000 mg | SUBCUTANEOUS | Status: DC
  Administered 2019-03-13: 40 mg via SUBCUTANEOUS
  Filled 2019-03-12: qty 0.4

## 2019-03-12 MED ORDER — ONDANSETRON HCL 4 MG/2ML IJ SOLN
4.0000 mg | Freq: Once | INTRAMUSCULAR | Status: AC
  Administered 2019-03-12: 4 mg via INTRAVENOUS
  Filled 2019-03-12: qty 2

## 2019-03-12 MED ORDER — DOCUSATE SODIUM 100 MG PO CAPS
100.0000 mg | ORAL_CAPSULE | Freq: Two times a day (BID) | ORAL | Status: DC
  Filled 2019-03-12 (×3): qty 1

## 2019-03-12 MED ORDER — ACETAMINOPHEN 325 MG PO TABS
650.0000 mg | ORAL_TABLET | Freq: Four times a day (QID) | ORAL | Status: DC | PRN
  Administered 2019-03-12 (×2): 650 mg via ORAL
  Filled 2019-03-12 (×2): qty 2

## 2019-03-12 MED ORDER — ACETAMINOPHEN 650 MG RE SUPP: 650.0000 mg | Freq: Four times a day (QID) | RECTAL | Status: DC | PRN

## 2019-03-12 MED ORDER — VANCOMYCIN HCL IN DEXTROSE 750-5 MG/150ML-% IV SOLN
750.0000 mg | Freq: Once | INTRAVENOUS | Status: AC
  Administered 2019-03-12: 750 mg via INTRAVENOUS
  Filled 2019-03-12: qty 150

## 2019-03-12 MED ORDER — VANCOMYCIN HCL 1000 MG IV SOLR
1000.0000 mg | Freq: Two times a day (BID) | INTRAVENOUS | Status: DC
  Filled 2019-03-12: qty 1000

## 2019-03-12 MED ORDER — POTASSIUM CHLORIDE CRYS ER 20 MEQ PO TBCR
40.0000 meq | EXTENDED_RELEASE_TABLET | Freq: Once | ORAL | Status: AC
  Administered 2019-03-12: 40 meq via ORAL
  Filled 2019-03-12: qty 2

## 2019-03-12 NOTE — Progress Notes (Signed)
Admitted today morning for foot infection, right breast abscess.  Patient told me that she has been having right breast infection since 2016 had multiple surgeries in [place] including removal also recently in 2018.  Patient had surgeries for the right breast in [place], admitted this morning for sepsis due to right breast abscess and also tooth infection. Sepsis present on admission secondary to right breast abscess, continue IV vancomycin, consult surgery due to recurrent breast abscess, possible mastectomy if needed.  Discussed with surgery PA. 2.  Poor condition, continue vancomycin, patient needs to see dentist as an outpatient she has no insurance appreciate case manager help with set up community dental clinics. 3.  Hypokalemia, replace potassium 4.  Tobacco abuse, polysubstance abuse, counseled to quit, patient says that she is in the process of quitting tobacco, urine toxicology positive for cannabinoids.

## 2019-03-12 NOTE — H&P (Signed)
Megan Solis is an 43 y.o. female.   Chief Complaint: Toothache HPI: The patient with past medical history of hypertension presents to the emergency department planing of a toothache.  The patient reports that she has left lower jaw pain.  She denies fever, nausea, vomiting or diarrhea.  She also reports tenderness of her right breast.  Ultrasound revealed a complex fluid collection of the right breast approximately 3 cm x 2 cm.  The patient had been on Augmentin as an outpatient.  Vital signs and white blood cell count did not indicate sepsis however the patient was found to have an elevated lactic acid which prompted the emergency department staff call hospitalist service for admission.  Past Medical History:  Diagnosis Date  . Abscess of right breast   . Alcohol abuse   . Hypertension   . MRSA (methicillin resistant Staphylococcus aureus)   . Pneumonia   . Seizures (HCC)    only when coming off alcohol  . Tobacco abuse     Past Surgical History:  Procedure Laterality Date  . INCISION AND DRAINAGE Right    R breast I&D    Family History  Problem Relation Age of Onset  . Cancer Father    Social History:  reports that she has been smoking cigarettes. She has been smoking about 1.00 pack per day. She has never used smokeless tobacco. She reports current alcohol use. She reports previous drug use.  Allergies: No Known Allergies  Medications Prior to Admission  Medication Sig Dispense Refill  . acamprosate (CAMPRAL) 333 MG tablet Take 2 tablets (666 mg total) by mouth 3 (three) times daily with meals. For cravings 180 tablet 0  . amoxicillin-clavulanate (AUGMENTIN) 500-125 MG tablet Take 1 tablet (500 mg total) by mouth 3 (three) times daily. Take until comoplete 21 tablet 0  . atenolol (TENORMIN) 25 MG tablet Take 0.5 tablets (12.5 mg total) by mouth 2 (two) times daily. 30 tablet 0  . doxycycline (VIBRAMYCIN) 100 MG capsule Take 1 capsule (100 mg total) by mouth 2 (two) times  daily. 20 capsule 0  . gabapentin (NEURONTIN) 300 MG capsule Take 1 capsule (300 mg total) by mouth 3 (three) times daily. For withdrawal symptoms 90 capsule 0  . traZODone (DESYREL) 50 MG tablet Take 1 tablet (50 mg total) by mouth at bedtime as needed for sleep. 30 tablet 0    Results for orders placed or performed during the hospital encounter of 03/11/19 (from the past 48 hour(s))  Comprehensive metabolic panel     Status: Abnormal   Collection Time: 03/11/19  8:41 PM  Result Value Ref Range   Sodium 142 135 - 145 mmol/L   Potassium 3.1 (L) 3.5 - 5.1 mmol/L   Chloride 102 98 - 111 mmol/L   CO2 26 22 - 32 mmol/L   Glucose, Bld 143 (H) 70 - 99 mg/dL   BUN 17 6 - 20 mg/dL   Creatinine, Ser 1.610.81 0.44 - 1.00 mg/dL   Calcium 9.0 8.9 - 09.610.3 mg/dL   Total Protein 7.4 6.5 - 8.1 g/dL   Albumin 4.6 3.5 - 5.0 g/dL   AST 37 15 - 41 U/L   ALT 25 0 - 44 U/L   Alkaline Phosphatase 69 38 - 126 U/L   Total Bilirubin 1.6 (H) 0.3 - 1.2 mg/dL   GFR calc non Af Amer >60 >60 mL/min   GFR calc Af Amer >60 >60 mL/min   Anion gap 14 5 - 15  Comment: Performed at [facility], [address] [place], [place] [address]  Lactic acid, plasma     Status: Abnormal   Collection Time: 03/11/19  8:41 PM  Result Value Ref Range   Lactic Acid, Venous 3.5 (HH) 0.5 - 1.9 mmol/L    Comment: CRITICAL RESULT CALLED TO, READ BACK BY AND VERIFIED WITH [facility] SUMMERS AT 2123 03/11/2019 SMA Performed at [facility], [address] [place], [place] [address]   CBC with Differential     Status: Abnormal   Collection Time: 03/11/19  8:41 PM  Result Value Ref Range   WBC 9.7 4.0 - 10.5 K/uL   RBC 5.08 3.87 - 5.11 MIL/uL   Hemoglobin 14.1 12.0 - 15.0 g/dL   HCT 54.6 56.8 - 12.7 %   MCV 84.4 80.0 - 100.0 fL   MCH 27.8 26.0 - 34.0 pg   MCHC 32.9 30.0 - 36.0 g/dL   RDW 51.7 (H) 00.1 - 74.9 %   Platelets 396 150 - 400 K/uL   nRBC 0.0 0.0 - 0.2 %   Neutrophils Relative % 44 %   Neutro Abs 4.3  1.7 - 7.7 K/uL   Lymphocytes Relative 41 %   Lymphs Abs 3.9 0.7 - 4.0 K/uL   Monocytes Relative 13 %   Monocytes Absolute 1.2 (H) 0.1 - 1.0 K/uL   Eosinophils Relative 1 %   Eosinophils Absolute 0.1 0.0 - 0.5 K/uL   Basophils Relative 1 %   Basophils Absolute 0.1 0.0 - 0.1 K/uL   Immature Granulocytes 0 %   Abs Immature Granulocytes 0.02 0.00 - 0.07 K/uL    Comment: Performed at [facility], [address] [place], [place] [address]  Lactic acid, plasma     Status: Abnormal   Collection Time: 03/11/19 10:30 PM  Result Value Ref Range   Lactic Acid, Venous 2.3 (HH) 0.5 - 1.9 mmol/L    Comment: CRITICAL RESULT CALLED TO, READ BACK BY AND VERIFIED WITH REBECCA UHORCHUK AT 2259 03/11/2019 SMA Performed at [facility], [address], [place], [place] [address]    US Breast Ltd Uni Right Inc Axilla  Result Date: 03/11/2019 CLINICAL DATA:  Recurrent abscess EXAM: ULTRASOUND OF THE right BREAST COMPARISON:  Chest CT 04/07/2018 FINDINGS: Targeted ultrasound is performed, showing hypoechoic complex fluid collection versus mass at the 7 o'clock position 1 cm from nipple measuring 2.8 x 1.5 x 2.1 cm. IMPRESSION: Hypoechoic mass at the 7 o'clock position 1 cm from nipple which may reflect present complex fluid collection/abscess in the appropriate clinical setting RECOMMENDATION: 1. If a breast abscess, or possible abscess, is demonstrated by ultrasound in the absence of systemic illness, non-intact overlying skin, or other clinical features requiring hospital admission, it is recommended that patient be started on an appropriate course of antibiotics and referred to the [facility] the following morning for possible abscess drainage. 2. If patient does not have a primary care physician and is to be referred to the [facility], patient is to be first referred the following morning to the [facility] who has agreed  to aid in the outpatient management ([address] (phone# [phone]). Recommended antibiotics for outpatient treatment of breast abscess: No MRSA risk 1. Keflex 500 mg po qid 2. Clindamycin 300 mg po tid MRSA risk 1. Bactrim 2 tabs po bid 2. Doxycycline 100 mg po bid Subareolar abscess, recurrent abscess, nipple retraction 1. Augmentin 875 mg  po bid 2. Clindamycin 300 mg po tid BI-RADS CATEGORY  3: Probably benign. Electronically Signed   By: Jasmine Pang M.D.   On: 03/11/2019 21:43    Review of Systems  Constitutional: Negative for chills and fever.  HENT: Negative for sore throat and tinnitus.   Eyes: Negative for blurred vision and redness.  Respiratory: Negative for cough and shortness of breath.   Cardiovascular: Negative for chest pain, palpitations, orthopnea and PND.  Gastrointestinal: Negative for abdominal pain, diarrhea, nausea and vomiting.  Genitourinary: Negative for dysuria, frequency and urgency.  Musculoskeletal: Negative for joint pain and myalgias.  Skin: Negative for rash.       No lesions  Neurological: Negative for speech change, focal weakness and weakness.  Endo/Heme/Allergies: Does not bruise/bleed easily.       No temperature intolerance  Psychiatric/Behavioral: Negative for depression and suicidal ideas.    Blood pressure (!) 124/111, pulse (!) 102, temperature 98.8 F (37.1 C), temperature source Oral, resp. rate 17, height 5\' 4"  (1.626 m), weight 77.1 kg, last menstrual period 01/07/2019, SpO2 100 %. Physical Exam  Vitals reviewed. Constitutional: She is oriented to person, place, and time. She appears well-developed and well-nourished. No distress.  HENT:  Head: Normocephalic and atraumatic.  Mouth/Throat: Oropharynx is clear and moist.  Eyes: Pupils are equal, round, and reactive to light. Conjunctivae and EOM are normal. No scleral icterus.  Neck: Normal range of motion. Neck supple. No JVD present. No tracheal deviation present. No thyromegaly  present.  Cardiovascular: Normal rate, regular rhythm and normal heart sounds. Exam reveals no gallop and no friction rub.  No murmur heard. Respiratory: Effort normal and breath sounds normal. No respiratory distress.  GI: Soft. Bowel sounds are normal. She exhibits no distension. There is no abdominal tenderness.  Genitourinary:    Genitourinary Comments: Deferred   Musculoskeletal: Normal range of motion.        General: No edema.  Lymphadenopathy:    She has no cervical adenopathy.  Neurological: She is alert and oriented to person, place, and time. No cranial nerve deficit. She exhibits normal muscle tone.  Skin: Skin is warm and dry. No rash noted. No erythema.  Psychiatric: She has a normal mood and affect. Her behavior is normal. Judgment and thought content normal.     Assessment/Plan This is a 43 year old female admitted for lactic acidosis. 1.  Lactic acidosis: Multifactorial including dehydration and infection.  Aggressive IV hydration.  Continue to follow lactic acid.  Oddly, the patient does not meet criteria for sepsis at this time.  Rule out diabetes.  Check hemoglobin A1c. 2.  Breast abscess: Source of infection and right breast; recurrent.  Not amenable to incision and drainage per surgery service.  Continue vancomycin 3.  Hypertension: Uncontrolled; continue atenolol.  Patient has stage II hypertension.  Consider addition of second scheduled agent. 4.  Hypokalemia: Replete potassium 5.  DVT prophylaxis: Lovenox 6.  GI prophylaxis: None The patient is a full code.  Time spent on admission orders and patient care approximately 45 minutes  Arnaldo Natal, MD 03/12/2019, 4:26 AM

## 2019-03-12 NOTE — ED Provider Notes (Signed)
I assume care the patient from Dr. Ander Slade at 11:00 PM.  Ultrasound of the breast revealed2.8 x 1.5 x 2.1 cm complex fluid collection in the right breast.  Patient discussed with Dr. Everlene Farrier no surgeon who states that lesion not amenable to incision and drainage and that he thinks the patient would just need antibiotic therapy.  Patient discussed with Dr. Anne Hahn hospitalist for hospital admission further evaluation and management.   Darci Current, MD 03/12/19 [phone]

## 2019-03-12 NOTE — Consult Note (Signed)
[facility] SURGICAL CONSULTATION NOTE (initial) - cpt: [id number]   HISTORY OF PRESENT ILLNESS (HPI):  43 y.o. female presented to [facility] ED yesterday for evaluation of tooth abscess and right breast pain. Patient reports a history of similar pain in her right breast for many years now. She has require surgical intervention in the past for these breast abscesses last in 2018 in [place]. She reports that they told her "they would have to take her whole breast," which was concerning for her and she never followed up. She reports this new episode of right breast pain over the last 1-2 days. She endorses associated subjective fever with the pain. No complaints of chills, nausea, or emesis. No other associated symptoms. She does believe an area concerning for abscess "popped" this morning in the 6-7 o'clock region compared to her nipple on her right breast. This is similar to previous presentations of her previous right breast abscesses.   Surgery is consulted by hospitalist physician Dr. Luberta Mutter, MD in this context for evaluation and management of right breast abscess.   PAST MEDICAL HISTORY (PMH):  Past Medical History:  Diagnosis Date  . Abscess of right breast   . Alcohol abuse   . Hypertension   . MRSA (methicillin resistant Staphylococcus aureus)   . Pneumonia   . Seizures (HCC)    only when coming off alcohol  . Tobacco abuse      PAST SURGICAL HISTORY (PSH):  Past Surgical History:  Procedure Laterality Date  . INCISION AND DRAINAGE Right    R breast I&D     MEDICATIONS:  Prior to Admission medications   Medication Sig Start Date End Date Taking? Authorizing Provider  acamprosate (CAMPRAL) 333 MG tablet Take 2 tablets (666 mg total) by mouth 3 (three) times daily with meals. For cravings 04/19/18   Money, Gerlene Burdock, FNP  amoxicillin-clavulanate (AUGMENTIN) 500-125 MG tablet Take 1 tablet (500 mg total) by mouth 3 (three) times daily. Take until comoplete 04/19/18    Money, Gerlene Burdock, FNP  atenolol (TENORMIN) 25 MG tablet Take 0.5 tablets (12.5 mg total) by mouth 2 (two) times daily. 04/19/18   Money, Gerlene Burdock, FNP  doxycycline (VIBRAMYCIN) 100 MG capsule Take 1 capsule (100 mg total) by mouth 2 (two) times daily. 01/09/19   Schaevitz, Myra Rude, MD  gabapentin (NEURONTIN) 300 MG capsule Take 1 capsule (300 mg total) by mouth 3 (three) times daily. For withdrawal symptoms 05/10/18   Charm Rings, NP  traZODone (DESYREL) 50 MG tablet Take 1 tablet (50 mg total) by mouth at bedtime as needed for sleep. 04/19/18   Money, Gerlene Burdock, FNP     ALLERGIES:  No Known Allergies   SOCIAL HISTORY:  Social History   Socioeconomic History  . Marital status: Single    Spouse name: Not on file  . Number of children: Not on file  . Years of education: Not on file  . Highest education level: Not on file  Occupational History  . Not on file  Social Needs  . Financial resource strain: Not on file  . Food insecurity:    Worry: Not on file    Inability: Not on file  . Transportation needs:    Medical: Not on file    Non-medical: Not on file  Tobacco Use  . Smoking status: Current Every Day Smoker    Packs/day: 1.00    Types: Cigarettes  . Smokeless tobacco: Never Used  Substance and Sexual Activity  . Alcohol use:  Yes    Comment: 1/5 liquor daily  . Drug use: Not Currently  . Sexual activity: Yes    Birth control/protection: Condom  Lifestyle  . Physical activity:    Days per week: Not on file    Minutes per session: Not on file  . Stress: Not on file  Relationships  . Social connections:    Talks on phone: Not on file    Gets together: Not on file    Attends religious service: Not on file    Active member of club or organization: Not on file    Attends meetings of clubs or organizations: Not on file    Relationship status: Not on file  . Intimate partner violence:    Fear of current or ex partner: Not on file    Emotionally abused: Not on file     Physically abused: Not on file    Forced sexual activity: Not on file  Other Topics Concern  . Not on file  Social History Narrative  . Not on file     FAMILY HISTORY:  Family History  Problem Relation Age of Onset  . Cancer Father       REVIEW OF SYSTEMS:  Review of Systems  Constitutional: Positive for fever (Subjective). Negative for chills.  Respiratory: Negative for cough and shortness of breath.   Cardiovascular: Negative for chest pain and palpitations.  Gastrointestinal: Negative for abdominal pain, diarrhea, nausea and vomiting.  Genitourinary: Negative for dysuria and urgency.  Skin:       + Right Breast Pain  Neurological: Negative for dizziness and headaches.  All other systems reviewed and are negative.   VITAL SIGNS:  Temp:  [98.3 F (36.8 C)-98.9 F (37.2 C)] 98.8 F (37.1 C) (03/19 0324) Pulse Rate:  [90-102] 102 (03/19 0324) Resp:  [16-18] 17 (03/19 0324) BP: (124-138)/(85-111) 124/111 (03/19 0324) SpO2:  [95 %-100 %] 100 % (03/19 0324) Weight:  [77.1 kg] 77.1 kg (03/18 1925)     Height:  (162.6 cm) Weight: 77.1 kg BMI (Calculated): 29.17   INTAKE/OUTPUT:  This shift: Total I/O In: 240 [P.O.:240] Out: -   Last 2 shifts: @   PHYSICAL EXAM:  Physical Exam Vitals signs and nursing note reviewed. Exam conducted with a chaperone present.  Constitutional:      General: She is not in acute distress.    Appearance: Normal appearance. She is obese. She is not ill-appearing.  HENT:     Head: Normocephalic and atraumatic.  Eyes:     General: No scleral icterus.    Conjunctiva/sclera: Conjunctivae normal.  Cardiovascular:     Rate and Rhythm: Normal rate and regular rhythm.     Pulses: Normal pulses.     Heart sounds: Normal heart sounds. No murmur. No friction rub.  Pulmonary:     Effort: Pulmonary effort is normal. No respiratory distress.     Breath sounds: Normal breath sounds. No wheezing or rhonchi.  Chest:     Breasts:         Right: Skin change and tenderness present. No mass or nipple discharge.        Left: Normal. No mass, nipple discharge, skin change or tenderness.    Musculoskeletal: Normal range of motion.        General: No swelling or deformity.  Neurological:     General: No focal deficit present.     Mental Status: She is alert. She is disoriented.  Psychiatric:  Mood and Affect: Mood normal.        Behavior: Behavior normal.       Labs:  CBC Latest Ref Rng & Units 03/11/2019 05/09/2018 05/08/2018  WBC 4.0 - 10.5 K/uL 9.7 9.9 6.3  Hemoglobin 12.0 - 15.0 g/dL 09.4 70.9 62.8  Hematocrit 36.0 - 46.0 % 42.9 35.9(L) 37.5  Platelets 150 - 400 K/uL 396 391 430(H)   CMP Latest Ref Rng & Units 03/11/2019 05/10/2018 05/09/2018  Glucose 70 - 99 mg/dL 366(Q) 947(M) 546(T)  BUN 6 - 20 mg/dL 17 8 8   Creatinine 0.44 - 1.00 mg/dL 0.35 4.65 6.81  Sodium 135 - 145 mmol/L 142 140 136  Potassium 3.5 - 5.1 mmol/L 3.1(L) 3.6 3.4(L)  Chloride 98 - 111 mmol/L 102 105 104  CO2 22 - 32 mmol/L 26 22 20(L)  Calcium 8.9 - 10.3 mg/dL 9.0 2.7(N) 9.1  Total Protein 6.5 - 8.1 g/dL 7.4 - 7.8  Total Bilirubin 0.3 - 1.2 mg/dL 1.7(G) - 1.2  Alkaline Phos 38 - 126 U/L 69 - 49  AST 15 - 41 U/L 37 - 32  ALT 0 - 44 U/L 25 - 24     Imaging studies:   Right Breast US (03/11/2019) personally reviewed and radiologist report reviewed: IMPRESSION: 1) Hypoechoic mass at the 7 o'clock position 1 cm from nipple which may reflect present complex fluid collection/abscess in the appropriate clinical setting   Assessment/Plan: (ICD-10's: N61.1) 43 y.o. female with right breast pain and discharge most likely attributable to recurrent right breast abscesses, complicated by pertinent comorbidities including history of polysubstance abuse, HTN, history of MRSA infection, and obesity.   - Currently open and draining without appreciable evidence of abscess  - No emergent surgical intervention  - Would recommend starting 14  days of Augmentin BID for this, and she can follow up as an outpatient with Dr Aleen Campi in 2 weeks to discuss the potential of outpatient surgical management.    - Medical management of comorbidities as per primary team   All of the above findings and recommendations were discussed with the patient, and all of patient's questions were answered to her expressed satisfaction.  Thank you for the opportunity to participate in this patient's care.   -- Lynden Oxford, PA-C [facility] 03/12/2019, 11:41 AM [phone] M-F: 7am - 4pm

## 2019-03-12 NOTE — ED Notes (Signed)
Patient is still not feeling any better. Patient states she urinated on herself and was still not able to give a urine sample.

## 2019-03-12 NOTE — ED Notes (Signed)
ED TO INPATIENT HANDOFF REPORT  ED Nurse Name and Phone #: Cyndra Numbers  S Name/Age/Gender Megan Solis 43 y.o. female Room/Bed: ED03A/ED03A  Code Status   Code Status: Prior  Home/SNF/Other Home Patient oriented to: X4 Is this baseline? Yes   Triage Complete: Triage complete  Chief Complaint Toothache  Triage Note Pt has left side upper and lower toothache.  Pt also has abscess to right breast.  Pt alert  Speech clear.    Allergies No Known Allergies  Level of Care/Admitting Diagnosis ED Disposition    ED Disposition Condition Comment   Admit  Hospital Area: [facility] [[id number]]  Level of Care: Med-Surg [16]  Diagnosis: Lactic acidosis [623762]  Admitting Physician: Arnaldo Natal [[id number]]  Attending Physician: Arnaldo Natal [phone]  PT Class (Do Not Modify): Observation [104]  PT Acc Code (Do Not Modify): Observation [10022]       B Medical/Surgery History Past Medical History:  Diagnosis Date  . Abscess of right breast   . Alcohol abuse   . Hypertension   . MRSA (methicillin resistant Staphylococcus aureus)   . Pneumonia   . Seizures (HCC)    only when coming off alcohol  . Tobacco abuse    Past Surgical History:  Procedure Laterality Date  . INCISION AND DRAINAGE Right    R breast I&D     A IV Location/Drains/Wounds Patient Lines/Drains/Airways Status   Active Line/Drains/Airways    Name:   Placement date:   Placement time:   Site:   Days:   Peripheral IV 03/11/19 Right Antecubital   03/11/19    2040    Antecubital   1   Peripheral IV 03/11/19 Left Antecubital   03/11/19    2129    Antecubital   1   Wound / Incision (Open or Dehisced) 04/07/18 Other (Comment) Breast Right;Mid absess next to the areola.    04/07/18    0400    Breast   339          Intake/Output Last 24 hours  Intake/Output Summary (Last 24 hours) at 03/12/2019 0257 Last data filed at 03/12/2019 0025 Gross per 24 hour  Intake 2200  ml  Output -  Net 2200 ml    Labs/Imaging Results for orders placed or performed during the hospital encounter of 03/11/19 (from the past 48 hour(s))  Comprehensive metabolic panel     Status: Abnormal   Collection Time: 03/11/19  8:41 PM  Result Value Ref Range   Sodium 142 135 - 145 mmol/L   Potassium 3.1 (L) 3.5 - 5.1 mmol/L   Chloride 102 98 - 111 mmol/L   CO2 26 22 - 32 mmol/L   Glucose, Bld 143 (H) 70 - 99 mg/dL   BUN 17 6 - 20 mg/dL   Creatinine, Ser 0.62 0.44 - 1.00 mg/dL   Calcium 9.0 8.9 - 69.4 mg/dL   Total Protein 7.4 6.5 - 8.1 g/dL   Albumin 4.6 3.5 - 5.0 g/dL   AST 37 15 - 41 U/L   ALT 25 0 - 44 U/L   Alkaline Phosphatase 69 38 - 126 U/L   Total Bilirubin 1.6 (H) 0.3 - 1.2 mg/dL   GFR calc non Af Amer >60 >60 mL/min   GFR calc Af Amer >60 >60 mL/min   Anion gap 14 5 - 15    Comment: Performed at [facility], [address], [place], [place] [address]  Lactic acid, plasma  Status: Abnormal   Collection Time: 03/11/19  8:41 PM  Result Value Ref Range   Lactic Acid, Venous 3.5 (HH) 0.5 - 1.9 mmol/L    Comment: CRITICAL RESULT CALLED TO, READ BACK BY AND VERIFIED WITH [facility] SUMMERS AT 2123 03/11/2019 SMA Performed at [facility], [address] [place], [place] [address]   CBC with Differential     Status: Abnormal   Collection Time: 03/11/19  8:41 PM  Result Value Ref Range   WBC 9.7 4.0 - 10.5 K/uL   RBC 5.08 3.87 - 5.11 MIL/uL   Hemoglobin 14.1 12.0 - 15.0 g/dL   HCT 53.7 94.3 - 27.6 %   MCV 84.4 80.0 - 100.0 fL   MCH 27.8 26.0 - 34.0 pg   MCHC 32.9 30.0 - 36.0 g/dL   RDW 14.7 (H) 09.2 - 95.7 %   Platelets 396 150 - 400 K/uL   nRBC 0.0 0.0 - 0.2 %   Neutrophils Relative % 44 %   Neutro Abs 4.3 1.7 - 7.7 K/uL   Lymphocytes Relative 41 %   Lymphs Abs 3.9 0.7 - 4.0 K/uL   Monocytes Relative 13 %   Monocytes Absolute 1.2 (H) 0.1 - 1.0 K/uL   Eosinophils Relative 1 %   Eosinophils Absolute 0.1 0.0 - 0.5 K/uL   Basophils  Relative 1 %   Basophils Absolute 0.1 0.0 - 0.1 K/uL   Immature Granulocytes 0 %   Abs Immature Granulocytes 0.02 0.00 - 0.07 K/uL    Comment: Performed at [facility], [address] [place], [place] [address]  Lactic acid, plasma     Status: Abnormal   Collection Time: 03/11/19 10:30 PM  Result Value Ref Range   Lactic Acid, Venous 2.3 (HH) 0.5 - 1.9 mmol/L    Comment: CRITICAL RESULT CALLED TO, READ BACK BY AND VERIFIED WITH Ramah Langhans AT 2259 03/11/2019 SMA Performed at [facility], [address] [place], [place] [address]    US Breast Ltd Uni Right Inc Axilla  Result Date: 03/11/2019 CLINICAL DATA:  Recurrent abscess EXAM: ULTRASOUND OF THE right BREAST COMPARISON:  Chest CT 04/07/2018 FINDINGS: Targeted ultrasound is performed, showing hypoechoic complex fluid collection versus mass at the 7 o'clock position 1 cm from nipple measuring 2.8 x 1.5 x 2.1 cm. IMPRESSION: Hypoechoic mass at the 7 o'clock position 1 cm from nipple which may reflect present complex fluid collection/abscess in the appropriate clinical setting RECOMMENDATION: 1. If a breast abscess, or possible abscess, is demonstrated by ultrasound in the absence of systemic illness, non-intact overlying skin, or other clinical features requiring hospital admission, it is recommended that patient be started on an appropriate course of antibiotics and referred to the [facility] the following morning for possible abscess drainage. 2. If patient does not have a primary care physician and is to be referred to the [facility], patient is to be first referred the following morning to the [facility] who has agreed to aid in the outpatient management ([address]) (phone# [phone]). Recommended antibiotics for outpatient treatment of breast abscess: No MRSA risk 1. Keflex 500 mg po qid 2. Clindamycin 300 mg po tid MRSA risk 1.  Bactrim 2 tabs po bid 2. Doxycycline 100 mg po bid Subareolar abscess, recurrent abscess, nipple retraction 1. Augmentin 875 mg po bid 2. Clindamycin 300 mg po tid BI-RADS CATEGORY  3: Probably benign. Electronically Signed   By: Selena Batten  Jake SamplesFujinaga M.D.   On: 03/11/2019 21:43    Pending Labs Unresulted Labs (From admission, onward)    Start     Ordered   03/11/19 2001  Urinalysis, Complete w Microscopic  ONCE - STAT,   STAT     03/11/19 2012   03/11/19 2001  Urine Drug Screen, Qualitative  Once,   STAT     03/11/19 2012   03/11/19 2001  Culture, blood (routine x 2)  BLOOD CULTURE X 2,   STAT     03/11/19 2012   Signed and Held  Creatinine, serum  (enoxaparin (LOVENOX)    CrCl >/= 30 ml/min)  Weekly,   R    Comments:  while on enoxaparin therapy    Signed and Held   Signed and Held  TSH  Add-on,   R     Signed and Held   Signed and Held  Hemoglobin A1c  Add-on,   R     Signed and Held          Vitals/Pain Today's Vitals   03/11/19 1924 03/11/19 1925 03/11/19 2157 03/12/19 0150  BP: (!) 138/97  136/85 124/90  Pulse: 96  90 95  Resp: 18  17 16   Temp: 98.9 F (37.2 C)   98.3 F (36.8 C)  TempSrc: Oral   Oral  SpO2: 99%  95% 96%  Weight:  77.1 kg    Height:  5\' 4"  (1.626 m)    PainSc:  8       Isolation Precautions No active isolations  Medications Medications  vancomycin (VANCOCIN) IVPB 750 mg/150 ml premix (has no administration in time range)  sodium chloride 0.9 % bolus 1,000 mL (0 mLs Intravenous Stopped 03/11/19 2148)  lidocaine-EPINEPHrine (XYLOCAINE W/EPI) 2 %-1:100000 (with pres) injection 1.7 mL (1.7 mLs Infiltration Given 03/11/19 2100)  lidocaine-EPINEPHrine (XYLOCAINE W/EPI) 2 %-1:100000 (with pres) injection 1.7 mL (1.7 mLs Infiltration Given 03/11/19 2100)  sodium chloride 0.9 % bolus 1,000 mL (0 mLs Intravenous Stopped 03/12/19 0025)  vancomycin (VANCOCIN) IVPB 1000 mg/200 mL premix (0 mg Intravenous Stopped 03/11/19 2256)  ondansetron (ZOFRAN) injection 4 mg (4  mg Intravenous Given 03/11/19 2206)  ondansetron (ZOFRAN) injection 4 mg (4 mg Intravenous Given 03/12/19 0049)    Mobility walks Low fall risk   Focused Assessments infection   R Recommendations: See Admitting Provider Note  Report given to:   Additional Notes: has not given urine sample yet.

## 2019-03-12 NOTE — Consult Note (Signed)
Pharmacy Antibiotic Note  Megan Solis is a 43 y.o. female admitted on 03/11/2019 with cellulitis. Pharmacy has been consulted for Vancomycin dosing. Patient received vancomycin 1000mg  IV x 1 dose in ED.   Plan: Will give an additional vancomycin 750mg  x 1 for a total of 1750mg  loading dose, followed by Vancomycin 1000 mg IV Q 12 hrs.  Goal AUC 400-550. Expected AUC: 515 SCr used: 0.81  Height: 5\' 4"  (162.6 cm) Weight: 170 lb (77.1 kg) IBW/kg (Calculated) : 54.7  Temp (24hrs), Avg:98.6 F (37 C), Min:98.3 F (36.8 C), Max:98.9 F (37.2 C)  Recent Labs  Lab 03/11/19 2041 03/11/19 2230  WBC 9.7  --   CREATININE 0.81  --   LATICACIDVEN 3.5* 2.3*    Estimated Creatinine Clearance: 91.9 mL/min (by C-G formula based on SCr of 0.81 mg/dL).    No Known Allergies  Antimicrobials this admission: 3/19 vancomycin >>   Thank you for allowing pharmacy to be a part of this patient's care.  Gardner Candle, PharmD, BCPS Clinical Pharmacist 03/12/2019 3:00 AM

## 2019-03-13 MED ORDER — AMOXICILLIN-POT CLAVULANATE 875-125 MG PO TABS: 1.0000 | ORAL_TABLET | Freq: Two times a day (BID) | ORAL | 0 refills | Status: AC

## 2019-03-13 NOTE — Progress Notes (Signed)
     Megan Solis was admitted to the Hospital on 03/11/2019 and Discharged  03/13/2019 and should be excused from work/school   for 5 days starting 03/11/2019 , may return to work/school without any restrictions.    Katha Hamming M.D on 03/13/2019,at 10:42 AM

## 2019-03-13 NOTE — Progress Notes (Signed)
03/13/2019 11:46 AM  [name] to be D/C'd Home per MD order.  Discussed prescriptions and follow up appointments with the patient. Prescriptions given to patient, medication list explained in detail. Pt verbalized understanding.  Allergies as of 03/13/2019   No Known Allergies     Medication List    TAKE these medications   amoxicillin-clavulanate 875-125 MG tablet Commonly known as:  Augmentin Take 1 tablet by mouth 2 (two) times daily for 10 days.   ibuprofen 200 MG tablet Commonly known as:  ADVIL,MOTRIN Take 200 mg by mouth every 6 (six) hours as needed.       Vitals:   03/12/19 1944 03/13/19 1122  BP: 119/87 133/86  Pulse: 79 76  Resp: 20 18  Temp: 98.4 F (36.9 C) (!) 97.4 F (36.3 C)  SpO2: 97%     Skin clean, dry and intact without evidence of skin break down, no evidence of skin tears noted. IV catheter discontinued intact. Site without signs and symptoms of complications. Dressing and pressure applied. Pt denies pain at this time. No complaints noted.  An After Visit Summary was printed and given to the patient. Patient escorted via WC, and D/C home via private auto.  Bradly Chris

## 2019-03-13 NOTE — Discharge Summary (Signed)
Megan Solis, is a 43 y.o. female  DOB 12-18-1976  MRN [id number].  Admission date:  03/11/2019  Admitting Physician  Arnaldo Natal, MD  Discharge Date:  03/13/2019   Primary MD  Patient, No Pcp Per  Recommendations for primary care physician for things to follow:   Discharge home and follow-up with Dr. Aleen Campi in 2 weeks regarding breast abscess.   Admission Diagnosis  Abscess [L02.91] Abscess of right breast [N61.1]   Discharge Diagnosis  Abscess [L02.91] Abscess of right breast [N61.1]    Active Problems:   Lactic acidosis      Past Medical History:  Diagnosis Date  . Abscess of right breast   . Alcohol abuse   . Hypertension   . MRSA (methicillin resistant Staphylococcus aureus)   . Pneumonia   . Seizures (HCC)    only when coming off alcohol  . Tobacco abuse     Past Surgical History:  Procedure Laterality Date  . 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  Hospital Course  #1. sepsis present on admission, secondary to right breast abscess, infected tooth with for oral hygiene.  Patient admitted for hospitalist service and started on vancomycin, seen by surgery, r, recommended discharging her home with Augmentin for 14 days follow-up with her as outpatient for potential surgical management with Dr. Aleen Campi. 2.  Polysubstance  abuse 3.  Tobacco abuse: Counseled to quit both.    Discharge Condition: Stable  Follow UP  Follow-up Information    Megan Dodge, MD. Go on 03/27/2019.   Specialty:  General Surgery Why:  :15am Contact information: [address] [place] [address] [phone]             Discharge Instructions  and  Discharge Medications      Allergies as of 03/13/2019   No Known Allergies     Medication List    TAKE these medications   amoxicillin-clavulanate 875-125 MG tablet Commonly known as:  Augmentin Take 1 tablet by mouth 2 (two) times daily for 10 days.   ibuprofen 200 MG tablet Commonly known as:  ADVIL,MOTRIN Take 200 mg by mouth every 6 (six) hours as needed.         Diet and Activity recommendation: See Discharge Instructions above   Consults obtained -surgical consult   Major procedures and Radiology Reports - PLEASE review detailed and final reports for all details, in brief -      US Breast Ltd Uni Right Inc Axilla  Result Date: 03/11/2019 CLINICAL DATA:  Recurrent abscess EXAM: ULTRASOUND OF THE right BREAST COMPARISON:  Chest CT 04/07/2018 FINDINGS: Targeted ultrasound is performed, showing hypoechoic complex fluid collection versus mass at the 7 o'clock position 1 cm from nipple measuring 2.8 x 1.5 x 2.1 cm. IMPRESSION: Hypoechoic mass at the 7 o'clock position 1 cm from nipple which may reflect present complex fluid collection/abscess in the appropriate clinical setting RECOMMENDATION: 1. If a breast abscess, or possible abscess, is demonstrated  by ultrasound in the absence of systemic illness, non-intact overlying skin, or other clinical features requiring hospital admission, it is recommended that patient be started on an appropriate course of antibiotics and referred to the [facility] the following morning for possible abscess drainage. 2. If patient does not have a primary care physician and is to be  referred to the [facility], patient is to be first referred the following morning to the [facility] who has agreed to aid in the outpatient management ([address]) (phone# [phone]). Recommended antibiotics for outpatient treatment of breast abscess: No MRSA risk 1. Keflex 500 mg po qid 2. Clindamycin 300 mg po tid MRSA risk 1. Bactrim 2 tabs po bid 2. Doxycycline 100 mg po bid Subareolar abscess, recurrent abscess, nipple retraction 1. Augmentin 875 mg po bid 2. Clindamycin 300 mg po tid BI-RADS CATEGORY  3: Probably benign. Electronically Signed   By: Jasmine Pang M.D.   On: 03/11/2019 21:43    Micro Results     Recent Results (from the past 240 hour(s))  Culture, blood (routine x 2)     Status: None (Preliminary result)   Collection Time: 03/11/19  8:41 PM  Result Value Ref Range Status   Specimen Description BLOOD LEFT ANTECUBITAL  Final   Special Requests   Final    BOTTLES DRAWN AEROBIC AND ANAEROBIC Blood Culture adequate volume   Culture   Final    NO GROWTH 2 DAYS Performed at [facility], [address], [place], [place] [address]    Report Status PENDING  Incomplete  Culture, blood (routine x 2)     Status: None (Preliminary result)   Collection Time: 03/11/19  8:42 PM  Result Value Ref Range Status   Specimen Description BLOOD RIGHT ANTECUBITAL  Final   Special Requests   Final    BOTTLES DRAWN AEROBIC AND ANAEROBIC Blood Culture adequate volume   Culture   Final    NO GROWTH 2 DAYS Performed at [facility], [address], [place], [place] [address]    Report Status PENDING  Incomplete  MRSA PCR Screening     Status: None   Collection Time: 03/12/19  3:28 AM  Result Value Ref Range Status   MRSA by PCR NEGATIVE NEGATIVE Final    Comment:        The GeneXpert MRSA Assay (FDA approved for NASAL specimens only), is one component of a comprehensive MRSA colonization surveillance program.  It is not intended to diagnose MRSA infection nor to guide or monitor treatment for MRSA infections. Performed at [facility], [address] [place], [place] [address]        Today   Subjective:   Nytasia Yong today has no headache,no chest abdominal pain,no new weakness tingling or numbness, feels much better wants to go home today.   Objective:   Blood pressure 119/87, pulse 79, temperature 98.4 F (36.9 C), temperature source Oral, resp. rate 20, height 5\' 4"  (1.626 m), weight 76 kg, last menstrual period 01/07/2019, SpO2 97 %.   Intake/Output Summary (Last 24 hours) at 03/13/2019 1113 Last data filed at 03/13/2019 0730 Gross per 24 hour  Intake 1109.21 ml  Output 0 ml  Net 1109.21 ml    Exam Awake Alert, Oriented x 3, No new F.N deficits, Normal affect [place].AT,PERRAL Supple Neck,No JVD, No cervical lymphadenopathy appriciated.  Symmetrical Chest wall movement, Good air movement bilaterally, CTAB RRR,No  Gallops,Rubs or new Murmurs, No Parasternal Heave +ve B.Sounds, Abd Soft, Non tender, No organomegaly appriciated, No rebound -guarding or rigidity. Right breast abscess draining, dressing present.  Data Review   CBC w Diff:  Lab Results  Component Value Date   WBC 9.7 03/11/2019   HGB 14.1 03/11/2019   HCT 42.9 03/11/2019   PLT 396 03/11/2019   LYMPHOPCT 41 03/11/2019   MONOPCT 13 03/11/2019   EOSPCT 1 03/11/2019   BASOPCT 1 03/11/2019    CMP:  Lab Results  Component Value Date   NA 142 03/11/2019   K 3.1 (L) 03/11/2019   CL 102 03/11/2019   CO2 26 03/11/2019   BUN 17 03/11/2019   CREATININE 0.81 03/11/2019   PROT 7.4 03/11/2019   ALBUMIN 4.6 03/11/2019   BILITOT 1.6 (H) 03/11/2019   ALKPHOS 69 03/11/2019   AST 37 03/11/2019   ALT 25 03/11/2019  .   Total Time in preparing paper work, data evaluation and todays exam - 35 minutes  Katha Hamming M.D on 03/13/2019 at 11:13 AM    Note: This dictation was prepared with  Dragon dictation along with smaller phrase technology. Any transcriptional errors that result from this process are unintentional.

## 2019-03-16 LAB — CULTURE, BLOOD (ROUTINE X 2)
Culture: NO GROWTH
Culture: NO GROWTH
Special Requests: ADEQUATE
Special Requests: ADEQUATE

## 2019-03-27 ENCOUNTER — Inpatient Hospital Stay: Payer: Self-pay | Admitting: Surgery

## 2019-05-21 ENCOUNTER — Other Ambulatory Visit: Payer: Self-pay

## 2019-05-21 ENCOUNTER — Emergency Department
Admission: EM | Admit: 2019-05-21 | Discharge: 2019-05-21 | Disposition: A | Discharge status: Home / Self Care | Payer: Self-pay | Attending: Emergency Medicine | Admitting: Emergency Medicine

## 2019-05-21 DIAGNOSIS — I10 Essential (primary) hypertension: Secondary | ICD-10-CM | POA: Insufficient documentation

## 2019-05-21 DIAGNOSIS — Z79899 Other long term (current) drug therapy: Secondary | ICD-10-CM | POA: Insufficient documentation

## 2019-05-21 DIAGNOSIS — E860 Dehydration: Secondary | ICD-10-CM | POA: Insufficient documentation

## 2019-05-21 DIAGNOSIS — Z20828 Contact with and (suspected) exposure to other viral communicable diseases: Secondary | ICD-10-CM | POA: Insufficient documentation

## 2019-05-21 DIAGNOSIS — R1012 Left upper quadrant pain: Secondary | ICD-10-CM | POA: Insufficient documentation

## 2019-05-21 DIAGNOSIS — J029 Acute pharyngitis, unspecified: Secondary | ICD-10-CM | POA: Insufficient documentation

## 2019-05-21 DIAGNOSIS — F17210 Nicotine dependence, cigarettes, uncomplicated: Secondary | ICD-10-CM | POA: Insufficient documentation

## 2019-05-21 DIAGNOSIS — R1084 Generalized abdominal pain: Secondary | ICD-10-CM

## 2019-05-21 LAB — CBC WITH DIFFERENTIAL/PLATELET
Abs Immature Granulocytes: 0.02 10*3/uL (ref 0.00–0.07)
Basophils Absolute: 0.1 10*3/uL (ref 0.0–0.1)
Basophils Relative: 1 %
Eosinophils Absolute: 0.1 10*3/uL (ref 0.0–0.5)
Eosinophils Relative: 2 %
HCT: 37.2 % (ref 36.0–46.0)
Hemoglobin: 12.3 g/dL (ref 12.0–15.0)
Immature Granulocytes: 0 %
Lymphocytes Relative: 33 %
Lymphs Abs: 2.3 10*3/uL (ref 0.7–4.0)
MCH: 30.4 pg (ref 26.0–34.0)
MCHC: 33.1 g/dL (ref 30.0–36.0)
MCV: 91.9 fL (ref 80.0–100.0)
Monocytes Absolute: 0.5 10*3/uL (ref 0.1–1.0)
Monocytes Relative: 8 %
Neutro Abs: 4.1 10*3/uL (ref 1.7–7.7)
Neutrophils Relative %: 56 %
Platelets: 334 10*3/uL (ref 150–400)
RBC: 4.05 MIL/uL (ref 3.87–5.11)
RDW: 16.2 % — ABNORMAL HIGH (ref 11.5–15.5)
WBC: 7.1 10*3/uL (ref 4.0–10.5)
nRBC: 0 % (ref 0.0–0.2)

## 2019-05-21 LAB — COMPREHENSIVE METABOLIC PANEL WITH GFR
ALT: 17 U/L (ref 0–44)
AST: 29 U/L (ref 15–41)
Albumin: 4.3 g/dL (ref 3.5–5.0)
Alkaline Phosphatase: 52 U/L (ref 38–126)
Anion gap: 11 (ref 5–15)
BUN: 6 mg/dL (ref 6–20)
CO2: 22 mmol/L (ref 22–32)
Calcium: 8.8 mg/dL — ABNORMAL LOW (ref 8.9–10.3)
Chloride: 103 mmol/L (ref 98–111)
Creatinine, Ser: 0.59 mg/dL (ref 0.44–1.00)
GFR calc Af Amer: >60 mL/min
GFR calc non Af Amer: >60 mL/min
Glucose, Bld: 151 mg/dL — ABNORMAL HIGH (ref 70–99)
Potassium: 3 mmol/L — ABNORMAL LOW (ref 3.5–5.1)
Sodium: 136 mmol/L (ref 135–145)
Total Bilirubin: 1.6 mg/dL — ABNORMAL HIGH (ref 0.3–1.2)
Total Protein: 7.7 g/dL (ref 6.5–8.1)

## 2019-05-21 LAB — ETHANOL: Alcohol, Ethyl (B): <10 mg/dL (ref <10)

## 2019-05-21 LAB — LIPASE, BLOOD: Lipase: 41 U/L (ref 11–51)

## 2019-05-21 MED ORDER — PANTOPRAZOLE SODIUM 40 MG IV SOLR
40.0000 mg | Freq: Once | INTRAVENOUS | Status: AC
  Administered 2019-05-21: 40 mg via INTRAVENOUS
  Filled 2019-05-21 (×2): qty 40

## 2019-05-21 MED ORDER — ALUMINUM-MAGNESIUM-SIMETHICONE 200-200-20 MG/5ML PO SUSP: 30.0000 mL | Freq: Three times a day (TID) | ORAL | 0 refills | Status: DC

## 2019-05-21 MED ORDER — SODIUM CHLORIDE 0.9 % IV BOLUS
1000.0000 mL | Freq: Once | INTRAVENOUS | Status: AC
  Administered 2019-05-21: 1000 mL via INTRAVENOUS

## 2019-05-21 MED ORDER — FAMOTIDINE 20 MG PO TABS: 20.0000 mg | ORAL_TABLET | Freq: Two times a day (BID) | ORAL | 0 refills | Status: DC

## 2019-05-21 MED ORDER — ONDANSETRON 4 MG PO TBDP: 4.0000 mg | ORAL_TABLET | Freq: Three times a day (TID) | ORAL | 0 refills | Status: DC | PRN

## 2019-05-21 MED ORDER — ONDANSETRON HCL 4 MG/2ML IJ SOLN
4.0000 mg | Freq: Once | INTRAMUSCULAR | Status: AC
  Administered 2019-05-21: 4 mg via INTRAVENOUS
  Filled 2019-05-21: qty 2

## 2019-05-21 NOTE — ED Triage Notes (Signed)
Pt c/o abd pain with N/V, change in taste, body aches, sore throat since yesterday.

## 2019-05-21 NOTE — ED Notes (Signed)
Pt sitting on the side of the bed speaking with this RN, NAD, A&Ox4. Pt reports abdominal pain, nausea, vomiting, headache, sore throat since yesterday morning. Pt reports she had bloody diarrhea this am.

## 2019-05-21 NOTE — ED Provider Notes (Signed)
[facility] Emergency Department Provider Note  ____________________________________________  Time seen: Approximately 2:26 PM  I have reviewed the triage vital signs and the nursing notes.   HISTORY  Chief Complaint Abdominal Pain    HPI Megan Solis is a 43 y.o. female with a history of hypertension, alcohol abuse, who comes the ED  complaining of abdominal pain nausea vomiting body aches and sore throat that started yesterday, gradual onset, constant, no aggravating or alleviating factors.  Denies diarrhea fevers or chills.  No known sick contacts.     Past Medical History:  Diagnosis Date  . Abscess of right breast   . Alcohol abuse   . Hypertension   . MRSA (methicillin resistant Staphylococcus aureus)   . Pneumonia   . Seizures (HCC)    only when coming off alcohol  . Tobacco abuse      Patient Active Problem List   Diagnosis Date Noted  . Lactic acidosis 03/12/2019  . Alcohol abuse with alcohol-induced mood disorder (HCC) 05/09/2018  . MDD (major depressive disorder), single episode, moderate (HCC)   . Atypical pneumonia 04/08/2018  . Sepsis (HCC) 04/07/2018  . Pneumonia 04/07/2018  . Alcohol withdrawal (HCC) 04/07/2018  . History of seizure 04/07/2018  . Tobacco abuse 04/07/2018     Past Surgical History:  Procedure Laterality Date  . INCISION AND DRAINAGE Right    R breast I&D     Prior to Admission medications   Medication Sig Start Date End Date Taking? Authorizing Provider  aluminum-magnesium hydroxide-simethicone (MAALOX) 200-200-20 MG/5ML SUSP Take 30 mLs by mouth 4 (four) times daily -  before meals and at bedtime. 05/21/19   Sharman Cheek, MD  famotidine (PEPCID) 20 MG tablet Take 1 tablet (20 mg total) by mouth 2 (two) times daily. 05/21/19   Sharman Cheek, MD  ibuprofen (ADVIL,MOTRIN) 200 MG tablet Take 200 mg by mouth every 6 (six) hours as needed.    [provider]  ondansetron (ZOFRAN ODT) 4  MG disintegrating tablet Take 1 tablet (4 mg total) by mouth every 8 (eight) hours as needed for nausea or vomiting. 05/21/19   Sharman Cheek, MD     Allergies Patient has no known allergies.   Family History  Problem Relation Age of Onset  . Cancer Father     Social History Social History   Tobacco Use  . Smoking status: Current Every Day Smoker    Packs/day: 1.00    Types: Cigarettes  . Smokeless tobacco: Never Used  Substance Use Topics  . Alcohol use: Yes    Comment: 1/5 liquor daily  . Drug use: Not Currently    Review of Systems  Constitutional:   No fever or chills.  Positive body aches ENT:   Positive for sore throat. No rhinorrhea. Cardiovascular:   No chest pain or syncope. Respiratory:   No dyspnea or cough. Gastrointestinal: Positive abdominal pain and vomiting. Musculoskeletal:   Negative for focal pain or swelling All other systems reviewed and are negative except as documented above in ROS and HPI.  ____________________________________________   PHYSICAL EXAM:  VITAL SIGNS: ED Triage Vitals  Enc Vitals Group     BP 05/21/19 1010 (!) 161/95     Pulse Rate 05/21/19 1010 (!) 103     Resp 05/21/19 1010 18     Temp 05/21/19 1010 97.6 F (36.4 C)     Temp Source 05/21/19 1010 Oral     SpO2 05/21/19 1010 97 %     Weight  05/21/19 1011 175 lb (79.4 kg)     Height 05/21/19 1011  (1.6 m)     Head Circumference --      Peak Flow --      Pain Score 05/21/19 1011 4     Pain Loc --      Pain Edu? --      Excl. in GC? --     Vital signs reviewed, nursing assessments reviewed.   Constitutional:   Alert and oriented. Non-toxic appearance. Eyes:   Conjunctivae are normal. EOMI. PERRL. ENT      Head:   Normocephalic and atraumatic.      Nose:   No congestion/rhinnorhea.       Mouth/Throat:   MMM, no pharyngeal erythema. No peritonsillar mass.       Neck:   No meningismus. Full ROM. Hematological/Lymphatic/Immunilogical:   No cervical  lymphadenopathy. Cardiovascular:   RRR. Symmetric bilateral radial and DP pulses.  No murmurs. Cap refill less than 2 seconds. Respiratory:   Normal respiratory effort without tachypnea/retractions. Breath sounds are clear and equal bilaterally. No wheezes/rales/rhonchi. Gastrointestinal:   Soft with left upper quadrant tenderness. Non distended. There is no CVA tenderness.  No rebound, rigidity, or guarding.  Musculoskeletal:   Normal range of motion in all extremities. No joint effusions.  No lower extremity tenderness.  No edema. Neurologic:   Normal speech and language.  Motor grossly intact. No acute focal neurologic deficits are appreciated.  Skin:    Skin is warm, dry and intact. No rash noted.  No petechiae, purpura, or bullae.  ____________________________________________    LABS (pertinent positives/negatives) (all labs ordered are listed, but only abnormal results are displayed) Labs Reviewed  COMPREHENSIVE METABOLIC PANEL - Abnormal; Notable for the following components:      Result Value   Potassium 3.0 (*)    Glucose, Bld 151 (*)    Calcium 8.8 (*)    Total Bilirubin 1.6 (*)    All other components within normal limits  CBC WITH DIFFERENTIAL/PLATELET - Abnormal; Notable for the following components:   RDW 16.2 (*)    All other components within normal limits  NOVEL CORONAVIRUS, NAA (HOSPITAL ORDER, SEND-OUT TO REF LAB)  ETHANOL  LIPASE, BLOOD   ____________________________________________   EKG    ____________________________________________    RADIOLOGY  No results found.  ____________________________________________   PROCEDURES Procedures  ____________________________________________    CLINICAL IMPRESSION / ASSESSMENT AND PLAN / ED COURSE  Medications ordered in the ED: Medications  sodium chloride 0.9 % bolus 1,000 mL (1,000 mLs Intravenous New Bag/Given 05/21/19 1227)  ondansetron (ZOFRAN) injection 4 mg (4 mg Intravenous Given 05/21/19  1227)  pantoprazole (PROTONIX) injection 40 mg (40 mg Intravenous Given 05/21/19 1325)    Pertinent labs & imaging results that were available during my care of the patient were reviewed by me and considered in my medical decision making (see chart for details).  Megan Solis was evaluated in Emergency Department on 05/21/2019 for the symptoms described in the history of present illness. She was evaluated in the context of the global COVID-19 pandemic, which necessitated consideration that the patient might be at risk for infection with the SARS-CoV-2 virus that causes COVID-19. Institutional protocols and algorithms that pertain to the evaluation of patients at risk for COVID-19 are in a state of rapid change based on information released by regulatory bodies including the CDC and federal and state organizations. These policies and algorithms were followed during the patient's care in the  ED.   Patient presents with abdominal pain nausea vomiting, exam concerning for gastritis.  Vital signs are unremarkable although she does have evidence of some mild dehydration.  With the sore throat and body aches and resistance to universal masking and social isolation in [place], she is at risk for having a coronavirus infection.  Work-up is reassuring and she is stable for outpatient follow-up.  Of advised her that she should quarantine at home, will obtain a COVID test send out, follow-up with primary care.      ____________________________________________   FINAL CLINICAL IMPRESSION(S) / ED DIAGNOSES    Final diagnoses:  Generalized abdominal pain  Dehydration     ED Discharge Orders         Ordered    aluminum-magnesium hydroxide-simethicone (MAALOX) 200-200-20 MG/5ML SUSP  3 times daily before meals & bedtime     05/21/19 1427    famotidine (PEPCID) 20 MG tablet  2 times daily     05/21/19 1427    ondansetron (ZOFRAN ODT) 4 MG disintegrating tablet  Every 8 hours PRN      05/21/19 1427          Portions of this note were generated with dragon dictation software. Dictation errors may occur despite best attempts at proofreading.   Sharman CheekStafford, Emara Lichter, MD 05/21/19 [phone]

## 2019-05-21 NOTE — ED Notes (Signed)
Pt ambulated to the bathroom.  

## 2019-05-21 NOTE — Discharge Instructions (Signed)
Your labs were all okay today. You should quarantine at home and avoid contact with anyone else for the next 2 days until you Covid test result is back.  Take medications as prescribed to help control your symptoms, and drink lots of fluids to stay hydrated.

## 2019-05-22 LAB — NOVEL CORONAVIRUS, NAA (HOSP ORDER, SEND-OUT TO REF LAB; TAT 18-24 HRS): SARS-CoV-2, NAA: NOT DETECTED

## 2019-05-25 ENCOUNTER — Telehealth: Payer: Self-pay | Admitting: Emergency Medicine

## 2019-05-25 NOTE — Telephone Encounter (Signed)
Called patient to give novel corona result.  Left message.

## 2019-07-10 ENCOUNTER — Encounter: Payer: Self-pay | Admitting: *Deleted

## 2020-10-05 IMAGING — US ULTRASOUND RIGHT BREAST LIMITED
1 series · 4 of 4 positions shown · non-contrast
Comparison: Chest CT 04/07/2018

CLINICAL DATA: Recurrent abscess

EXAM:
ULTRASOUND OF THE right BREAST

[Series 1: ultrasound right breast limited · 0.05mm/px · 4 of 4 slices shown]
[im 1/4]
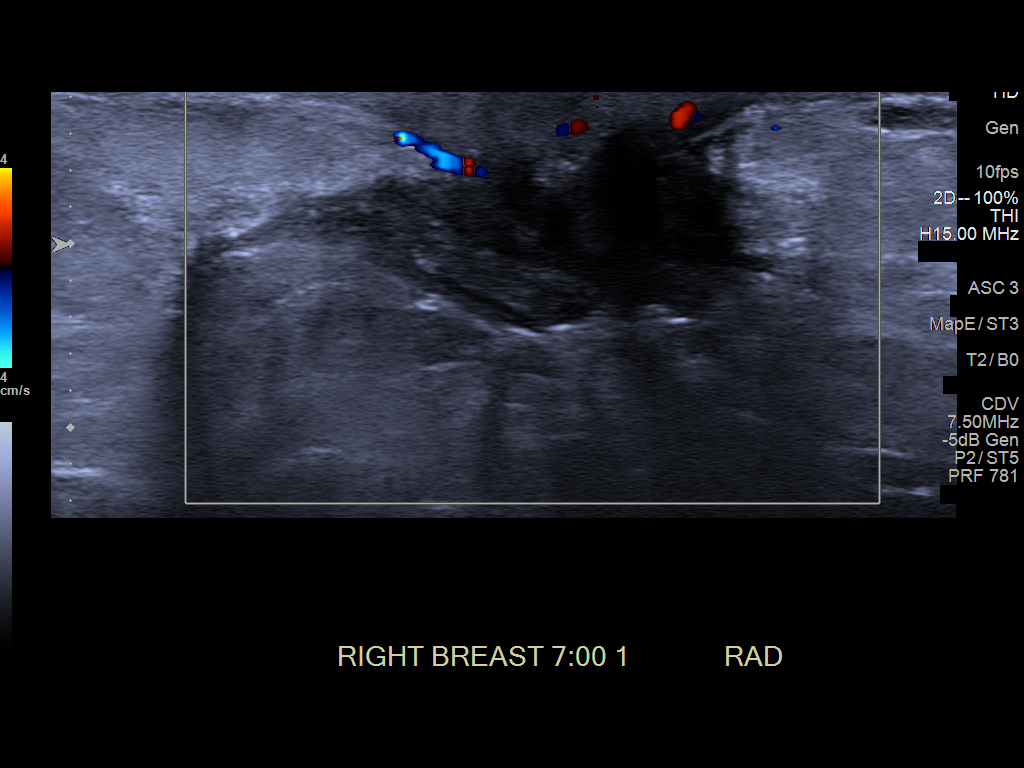
[im 2/4]
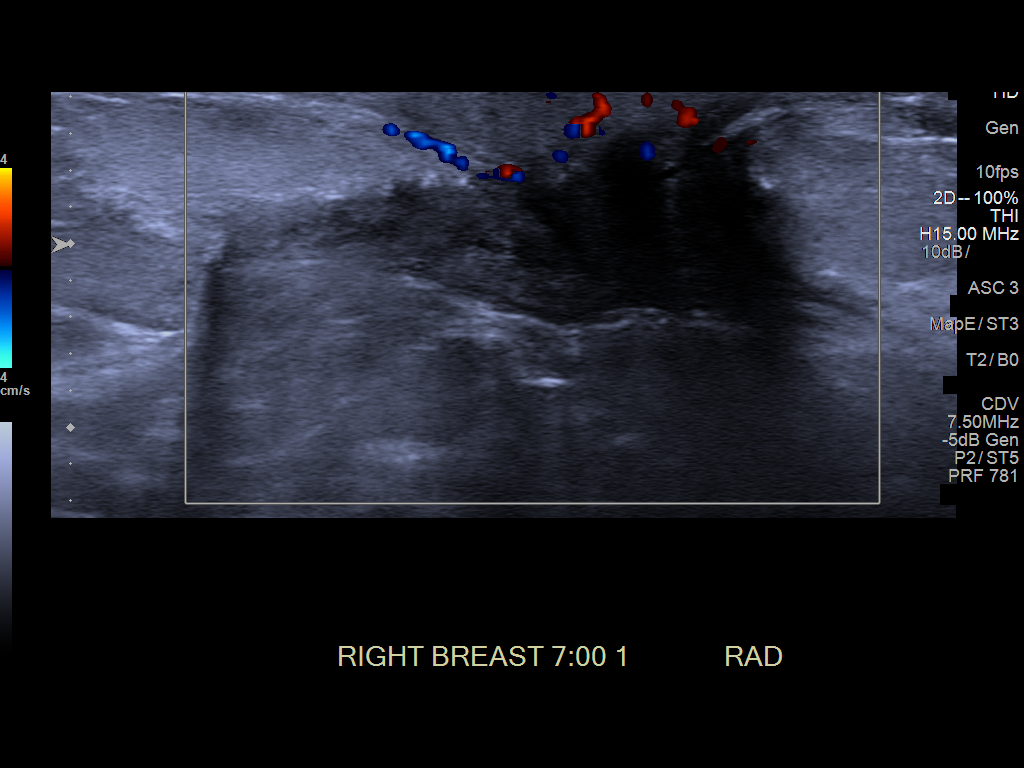
[im 3/4]
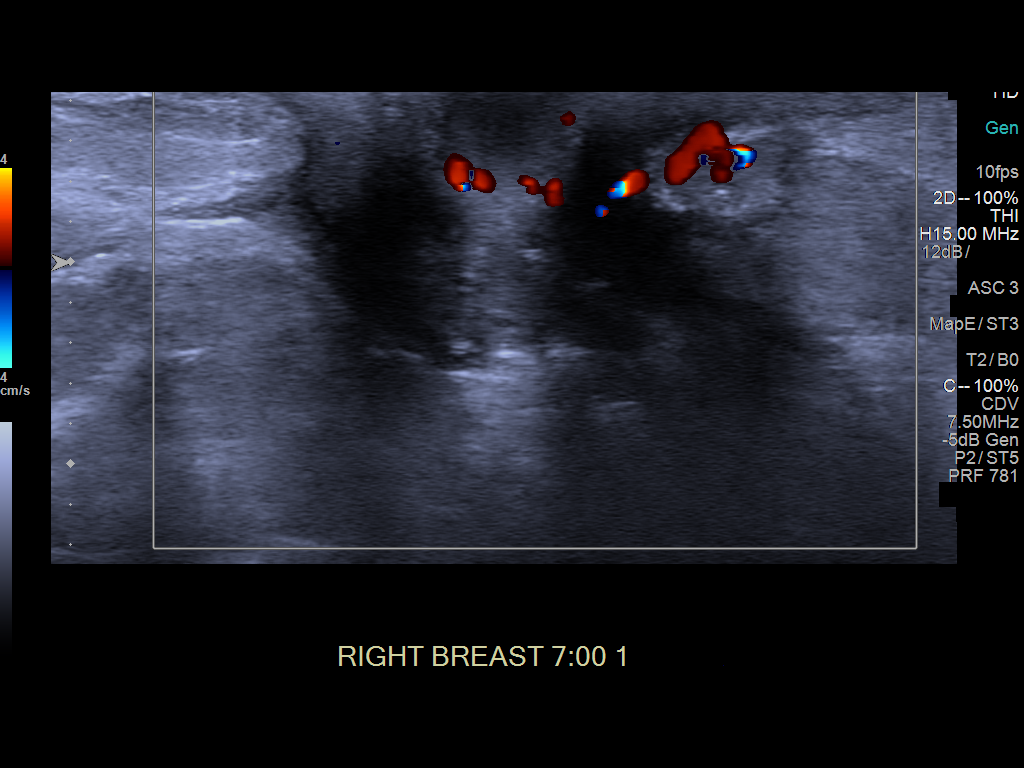
[im 4/4]
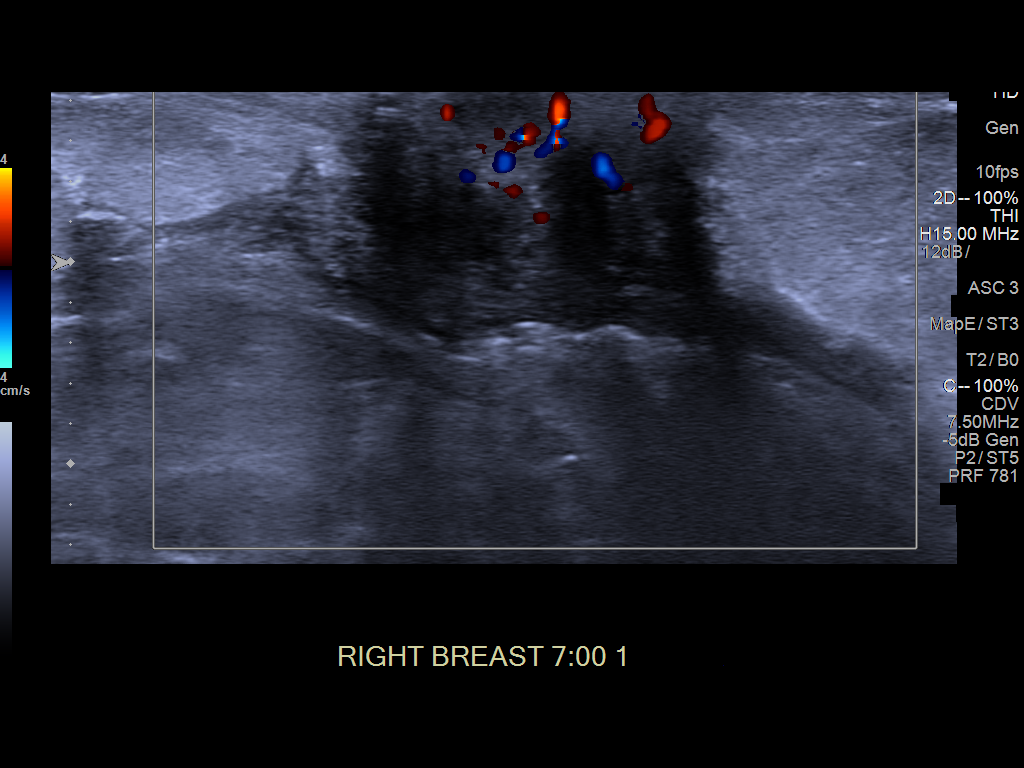

[4 of 4 positions shown; findings below may reference images not displayed]

FINDINGS: Targeted ultrasound is performed, showing hypoechoic complex fluid
collection versus mass at the 7 o'clock position 1 cm from nipple
measuring 2.8 x 1.5 x 2.1 cm.
IMPRESSION: Hypoechoic mass at the 7 o'clock position 1 cm from nipple which may
reflect present complex fluid collection/abscess in the appropriate
clinical setting

RECOMMENDATION:
1. If a breast abscess, or possible abscess, is demonstrated by
ultrasound in the absence of systemic illness, non-intact overlying
skin, or other clinical features requiring hospital admission, it is
recommended that patient be started on an appropriate course of
antibiotics and referred to the [REDACTED]
the following morning for possible abscess drainage.

2. If patient does not have a primary care physician and is to be
referred to the [REDACTED], patient is to
be first referred the following morning to the [HOSPITAL] [REDACTED] who has agreed to aid in the outpatient management

Recommended antibiotics for outpatient treatment of breast abscess:

No MRSA risk

1. Keflex 500 mg po qid
2. Clindamycin 300 mg po tid

MRSA risk

1. Bactrim 2 tabs po bid
2. Doxycycline 100 mg po bid

Subareolar abscess, recurrent abscess, nipple retraction

1. Augmentin 875 mg po bid
2. Clindamycin 300 mg po tid

BI-RADS CATEGORY  3: Probably benign.

## 2021-11-05 ENCOUNTER — Emergency Department
Admission: EM | Admit: 2021-11-05 | Discharge: 2021-11-05 | Disposition: A | Discharge status: Home / Self Care | Payer: Self-pay | Attending: Emergency Medicine | Admitting: Emergency Medicine

## 2021-11-05 ENCOUNTER — Other Ambulatory Visit: Payer: Self-pay

## 2021-11-05 DIAGNOSIS — F17210 Nicotine dependence, cigarettes, uncomplicated: Secondary | ICD-10-CM | POA: Insufficient documentation

## 2021-11-05 DIAGNOSIS — N61 Mastitis without abscess: Secondary | ICD-10-CM | POA: Insufficient documentation

## 2021-11-05 DIAGNOSIS — I10 Essential (primary) hypertension: Secondary | ICD-10-CM | POA: Insufficient documentation

## 2021-11-05 LAB — LACTIC ACID, PLASMA
Lactic Acid, Venous: 2.1 mmol/L (ref 0.5–1.9)
Lactic Acid, Venous: 1.9 mmol/L (ref 0.5–1.9)

## 2021-11-05 LAB — COMPREHENSIVE METABOLIC PANEL
ALT: 17 U/L (ref 0–44)
AST: 27 U/L (ref 15–41)
Albumin: 4.4 g/dL (ref 3.5–5.0)
Alkaline Phosphatase: 44 U/L (ref 38–126)
Anion gap: 6 (ref 5–15)
BUN: 13 mg/dL (ref 6–20)
CO2: 27 mmol/L (ref 22–32)
Calcium: 9.1 mg/dL (ref 8.9–10.3)
Chloride: 107 mmol/L (ref 98–111)
Creatinine, Ser: 0.69 mg/dL (ref 0.44–1.00)
GFR, Estimated: >60 mL/min (ref >60)
Glucose, Bld: 99 mg/dL (ref 70–99)
Potassium: 4.4 mmol/L (ref 3.5–5.1)
Sodium: 140 mmol/L (ref 135–145)
Total Bilirubin: 0.9 mg/dL (ref 0.3–1.2)
Total Protein: 7.5 g/dL (ref 6.5–8.1)

## 2021-11-05 LAB — CBC WITH DIFFERENTIAL/PLATELET
Abs Immature Granulocytes: 0.02 10*3/uL (ref 0.00–0.07)
Basophils Absolute: 0.1 10*3/uL (ref 0.0–0.1)
Basophils Relative: 1 %
Eosinophils Absolute: 0.5 10*3/uL (ref 0.0–0.5)
Eosinophils Relative: 5 %
HCT: 42.3 % (ref 36.0–46.0)
Hemoglobin: 14.3 g/dL (ref 12.0–15.0)
Immature Granulocytes: 0 %
Lymphocytes Relative: 36 %
Lymphs Abs: 3.3 10*3/uL (ref 0.7–4.0)
MCH: 31 pg (ref 26.0–34.0)
MCHC: 33.8 g/dL (ref 30.0–36.0)
MCV: 91.6 fL (ref 80.0–100.0)
Monocytes Absolute: 0.8 10*3/uL (ref 0.1–1.0)
Monocytes Relative: 9 %
Neutro Abs: 4.5 10*3/uL (ref 1.7–7.7)
Neutrophils Relative %: 49 %
Platelets: 326 10*3/uL (ref 150–400)
RBC: 4.62 MIL/uL (ref 3.87–5.11)
RDW: 13.2 % (ref 11.5–15.5)
WBC: 9.1 10*3/uL (ref 4.0–10.5)
nRBC: 0 % (ref 0.0–0.2)

## 2021-11-05 MED ORDER — DOXYCYCLINE HYCLATE 100 MG PO TABS: 100.0000 mg | ORAL_TABLET | Freq: Two times a day (BID) | ORAL | 0 refills | Status: DC

## 2021-11-05 MED ORDER — TRAMADOL HCL 50 MG PO TABS: 50.0000 mg | ORAL_TABLET | Freq: Four times a day (QID) | ORAL | 0 refills | Status: DC | PRN

## 2021-11-05 NOTE — ED Provider Notes (Signed)
[facility] Emergency Department Provider Note   ____________________________________________    I have reviewed the triage vital signs and the nursing notes.   HISTORY  Chief Complaint Breast pain     HPI Megan Solis is a 45 y.o. female who presents with complaints of right breast pain.  Patient reports for the last 6 or 7 years she has had a chronic infection in her right breast, she reports numerous procedures which have been unsuccessful.  She has tried several different types of antibiotics.  She presents today stating that she is interested in having a right mastectomy.  Denies fevers or chills.  No nausea or vomiting.  Past Medical History:  Diagnosis Date   Abscess of right breast    Alcohol abuse    Hypertension    MRSA (methicillin resistant Staphylococcus aureus)    Pneumonia    Seizures (HCC)    only when coming off alcohol   Tobacco abuse     Patient Active Problem List   Diagnosis Date Noted   Lactic acidosis 03/12/2019   Alcohol abuse with alcohol-induced mood disorder (HCC) 05/09/2018   MDD (major depressive disorder), single episode, moderate (HCC)    Atypical pneumonia 04/08/2018   Sepsis (HCC) 04/07/2018   Pneumonia 04/07/2018   Alcohol withdrawal (HCC) 04/07/2018   History of seizure 04/07/2018   Tobacco abuse 04/07/2018    Past Surgical History:  Procedure Laterality Date   INCISION AND DRAINAGE Right    R breast I&D    Prior to Admission medications   Medication Sig Start Date End Date Taking? Authorizing Provider  doxycycline (VIBRA-TABS) 100 MG tablet Take 1 tablet (100 mg total) by mouth 2 (two) times daily. 11/05/21  Yes Jene Every, MD  traMADol (ULTRAM) 50 MG tablet Take 1 tablet (50 mg total) by mouth every 6 (six) hours as needed. 11/05/21 11/05/22 Yes Jene Every, MD  aluminum-magnesium hydroxide-simethicone (MAALOX) 200-200-20 MG/5ML SUSP Take 30 mLs by mouth 4 (four) times daily -  before  meals and at bedtime. 05/21/19   Sharman Cheek, MD  famotidine (PEPCID) 20 MG tablet Take 1 tablet (20 mg total) by mouth 2 (two) times daily. 05/21/19   Sharman Cheek, MD  ibuprofen (ADVIL,MOTRIN) 200 MG tablet Take 200 mg by mouth every 6 (six) hours as needed.    [provider]  ondansetron (ZOFRAN ODT) 4 MG disintegrating tablet Take 1 tablet (4 mg total) by mouth every 8 (eight) hours as needed for nausea or vomiting. 05/21/19   Sharman Cheek, MD     Allergies Patient has no known allergies.  Family History  Problem Relation Age of Onset   Cancer Father     Social History Social History   Tobacco Use   Smoking status: Every Day    Packs/day: 1.00    Types: Cigarettes   Smokeless tobacco: Never  Substance Use Topics   Alcohol use: Yes    Comment: 1/5 liquor daily   Drug use: Not Currently    Review of Systems  Constitutional: No fever/chills Eyes: No visual changes.  ENT: No sore throat. Cardiovascular: Denies chest pain. Respiratory: Denies shortness of breath. Gastrointestinal: No abdominal pain.  No nausea, no vomiting.   Genitourinary: Negative for dysuria. Musculoskeletal: Negative for back pain. Skin: As above Neurological: Negative for headaches or weakness   ____________________________________________   PHYSICAL EXAM:  VITAL SIGNS: ED Triage Vitals  Enc Vitals Group     BP 11/05/21 0319 (!) 155/98  Pulse Rate 11/05/21 0319 (!) 105     Resp 11/05/21 0319 18     Temp 11/05/21 0319 98.3 F (36.8 C)     Temp Source 11/05/21 0319 Oral     SpO2 11/05/21 0319 97 %     Weight 11/05/21 0320 71.7 kg (158 lb)     Height 11/05/21 0320 1.626 m (5\' 4" )     Head Circumference --      Peak Flow --      Pain Score 11/05/21 0320 8     Pain Loc --      Pain Edu? --      Excl. in GC? --     Constitutional: Alert and oriented. No acute distress. Pleasant  Nose: No congestion/rhinnorhea. Mouth/Throat: Mucous membranes are moist.    Neck:  Painless ROM Cardiovascular: Normal rate, regular rhythm. s.  Good peripheral circulation. Respiratory: Normal respiratory effort.  No retractions.   Genitourinary: deferred Musculoskeletal: No lower extremity tenderness nor edema.  Warm and well perfused Neurologic:  Normal speech and language. No gross focal neurologic deficits are appreciated.  Skin:  Skin is warm, dry and intact.  Chaperone present.  Right breast evidence of prior procedures, mild erythema surrounding the areola, no discharge, no palpable abscess, mild tenderness to palpation Psychiatric: Mood and affect are normal. Speech and behavior are normal.  ____________________________________________   LABS (all labs ordered are listed, but only abnormal results are displayed)  Labs Reviewed  LACTIC ACID, PLASMA - Abnormal; Notable for the following components:      Result Value   Lactic Acid, Venous 2.1 (*)    All other components within normal limits  CBC WITH DIFFERENTIAL/PLATELET  COMPREHENSIVE METABOLIC PANEL  LACTIC ACID, PLASMA   ____________________________________________  EKG   ____________________________________________  RADIOLOGY   ____________________________________________   PROCEDURES  Procedure(s) performed: No  Procedures   Critical Care performed: No ____________________________________________   INITIAL IMPRESSION / ASSESSMENT AND PLAN / ED COURSE  Pertinent labs & imaging results that were available during my care of the patient were reviewed by me and considered in my medical decision making (see chart for details).   Patient overall well-appearing and in no acute distress, afebrile, normal white blood cell count, initial lactic mildly elevated however repeat i has normalized.  Patient's presentation is not consistent with sepsis.  She has had years of issues with her right breast, she is frustrated and would like to arrange for mastectomy.  Discussed with her that we  will need to have her follow-up with general surgery, will start the patient on doxycycline for presumed MRSA cellulitis, outpatient follow-up as discussed, return precautions    ____________________________________________   FINAL CLINICAL IMPRESSION(S) / ED DIAGNOSES  Final diagnoses:  Breast infection        Note:  This document was prepared using Dragon voice recognition software and may include unintentional dictation errors.    11/07/21, MD 11/05/21 [phone]

## 2021-11-05 NOTE — ED Triage Notes (Signed)
Pt states she has mrsa in right breast. Pt states is draining yellow drainage. Area around nipple is red and painful.

## 2021-11-05 NOTE — ED Provider Notes (Signed)
HPI: Pt is a 45 y.o. female who presents with complaints of MRSA on breast.  The patient p/w  breast wound.  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  No iv drugs.   ROS: Denies fever, chest pain, vomiting  Past Medical History:  Diagnosis Date   Abscess of right breast    Alcohol abuse    Hypertension    MRSA (methicillin resistant Staphylococcus aureus)    Pneumonia    Seizures (HCC)    only when coming off alcohol   Tobacco abuse    There were no vitals filed for this visit.  Focused Physical Exam: Gen: No acute distress Head: atraumatic, normocephalic Eyes: Extraocular movements grossly intact; conjunctiva clear CV: RRR Lung: No increased WOB, no stridor GI: ND, no obvious masses Neuro: Alert and awake  Medical Decision Making and Plan: Given the patient's initial medical screening exam, the following diagnostic evaluation has been ordered. The patient will be placed in the appropriate treatment space, once one is available, to complete the evaluation and treatment. I have discussed the plan of care with the patient and I have advised the patient that an ED physician or mid-level practitioner will reevaluate their condition after the test results have been received, as the results may give them additional insight into the type of treatment they may need.   Diagnostics: labs   Treatments: none immediately   Concha Se, MD 11/05/21 [phone]

## 2021-11-10 ENCOUNTER — Other Ambulatory Visit: Payer: Self-pay | Admitting: Surgery

## 2021-11-10 ENCOUNTER — Encounter: Payer: Self-pay | Admitting: Surgery

## 2021-11-10 ENCOUNTER — Ambulatory Visit (INDEPENDENT_AMBULATORY_CARE_PROVIDER_SITE_OTHER): Payer: Self-pay | Admitting: Surgery

## 2021-11-10 ENCOUNTER — Other Ambulatory Visit: Payer: Self-pay

## 2021-11-10 VITALS — BP 150/92 | HR 84 | Temp 98.1°F | Ht 65.0 in | Wt 157.0 lb

## 2021-11-10 DIAGNOSIS — Z1231 Encounter for screening mammogram for malignant neoplasm of breast: Secondary | ICD-10-CM

## 2021-11-10 DIAGNOSIS — N611 Abscess of the breast and nipple: Secondary | ICD-10-CM

## 2021-11-10 DIAGNOSIS — N6452 Nipple discharge: Secondary | ICD-10-CM

## 2021-11-10 DIAGNOSIS — N61 Mastitis without abscess: Secondary | ICD-10-CM

## 2021-11-10 NOTE — Progress Notes (Signed)
11/10/2021  History of Present Illness: Megan Solis is a 45 y.o. female presenting for evaluation of a right breast abscess.  Patient was seen by me on 03/12/2019 as a consult while she was hospitalized for a right breast abscess.  She has a history of chronic right breast abscess and has had 4 surgeries according to her for incision and drainage procedures.  The last surgery was a few years ago however.  On that hospitalization, she did not require any incision or drainage procedure and she was treated conservatively with Augmentin.  At that point, somebody had discussed with her the potential need for a mastectomy given the repeated infections.  The patient reports that at that point she was not really ready to have such a definitive surgery.  However she reports that since her hospitalization she continues to be dealing with recurrent infections.  She drains them herself by squeezing the area just inferior to the nipple.  She reports that she will have intermittent drainage from below the nipple and through the nipple itself as well.  Reports intermittent pain in the right breast as well.  She also reports that she has noticed brown to bloody drainage coming from the left nipple but denies any pain or areas of mass or induration.  She does not have a recent mammogram.  Denies any fevers, chills, chest pain, shortness of breath, nausea, vomiting.  She does smoke.  Past Medical History: Past Medical History:  Diagnosis Date   Abscess of right breast    Alcohol abuse    Hypertension    MRSA (methicillin resistant Staphylococcus aureus)    Pneumonia    Seizures (HCC)    only when coming off alcohol   Tobacco abuse      Past Surgical History: Past Surgical History:  Procedure Laterality Date   INCISION AND DRAINAGE Right    R breast I&D    Home Medications: Prior to Admission medications   Not on File    Allergies: No Known Allergies  Review of Systems: Review of Systems   Constitutional:  Negative for chills and fever.  HENT:  Negative for hearing loss.   Respiratory:  Negative for shortness of breath.   Cardiovascular:  Negative for chest pain.  Gastrointestinal:  Negative for abdominal pain, nausea and vomiting.  Skin:        Right breast wound and drainage.   Physical Exam BP (!) 150/92   Pulse 84   Temp 98.1 F (36.7 C) (Oral)   Ht 5\' 5"  (1.651 m)   Wt 157 lb (71.2 kg)   LMP  (LMP Unknown)   SpO2 100%   BMI 26.13 kg/m  CONSTITUTIONAL: No acute distress, well-nourished HEENT:  Normocephalic, atraumatic, extraocular motion intact. RESPIRATORY:  Lungs are clear, and breath sounds are equal bilaterally. Normal respiratory effort without pathologic use of accessory muscles. CARDIOVASCULAR: Heart is regular without murmurs, gallops, or rubs. BREAST: On the right breast, the patient has a small wound from her prior infections at the 6 to 7 o'clock position just inferior to the nipple.  The nipple itself is retracted and swollen from the chronic infections as well.  No fluid is able to be expressed in either location at this point but the patient is tender when trying to squeeze.  No palpable masses.  No right axillary lymphadenopathy.  Left breast has no palpable masses, skin changes, but does have milky drainage from the nipple upon manual expression.  No left axillary lymphadenopathy. NEUROLOGIC:  Motor and sensation is grossly normal.  Cranial nerves are grossly intact. PSYCH:  Alert and oriented to person, place and time. Affect is normal.  Labs/Imaging: No recent imaging  Assessment and Plan: This is a 45 y.o. female with recurrent right breast abscess and now some concern for left nipple discharge.  - Discussed with the patient-with regards to the right breast chronic wounds, she has had many procedures done in the past and I would agree that the recommendation would be for mastectomy in order to eliminate any residual tissue.  I think wider  excisional debridement would be even more deforming and so mastectomy would be better.  However with her chronic infections, I think we would need to push this in 2 stages.  The first stage would be with a I&D procedure in order to decrease the amount of infection in the wound bed and treated with antibiotics until healed.  Then this will be followed by the second step which would be a mastectomy.  This would decrease the risk of wound complications after the mastectomy as the wound bed and surrounding tissues would be healthier. - With regards to the left breast, I do not think that she would have an abscess at this point as there is no tenderness or erythema.  However she may have an intraductal papilloma.  She has not had any imaging of her breast and at least 2 years plus so I think it is worthwhile to obtain a bilateral mammogram with ultrasounds to make sure that were not missing any potential masses.  Prior to mastectomy, will be also helpful to make sure that there is no masses that would need to be addressed in a different scenario such as with a sentinel lymph node biopsy. - Patient will follow-up with me after the mammogram is done so we can discuss the results and talk further about timing and scheduling of surgery. -I also highly encouraged the patient to quit smoking.  Discussed with her that this is also contributing to the recurrent infections as the oxygen flow to the tissues is decreased by the smoking.  This will also only help her in the future when we plan a mastectomy in order to decrease the complication risks. - Patient understands this plan and all of her questions have been answered.  I spent 25 minutes dedicated to the care of this patient on the date of this encounter to include pre-visit review of records, face-to-face time with the patient discussing diagnosis and management, and any post-visit coordination of care.   Howie Ill, MD [facility]

## 2021-11-10 NOTE — Patient Instructions (Addendum)
The order has been placed for your Mammogram. You can call [facility] at  [phone] to schedule your appointment.  First Floor - [facility], [address] [place], [place], [place] [address]  If you have any concerns or questions, please feel free to call our office. Call our office to schedule an appointment after your Mammogram is complete.   Breast Self-Awareness Breast self-awareness is knowing how your breasts look and feel. Doing breast self-awareness is important. It allows you to catch a breast problem early while it is still small and can be treated. All women should do breast self-awareness, including women who have had breast implants. Tell your doctor if you notice a change in your breasts. What you need: A mirror. A well-lit room. How to do a breast self-exam A breast self-exam is one way to learn what is normal for your breasts and to check for changes. To do a breast self-exam: Look for changes  Take off all the clothes above your waist. Stand in front of a mirror in a room with good lighting. Put your hands on your hips. Push your hands down. Look at your breasts and nipples in the mirror to see if one breast or nipple looks different from the other. Check to see if: The shape of one breast is different. The size of one breast is different. There are wrinkles, dips, and bumps in one breast and not the other. Look at each breast for changes in the skin, such as: Redness. Scaly areas. Look for changes in your nipples, such as: Liquid around the nipples. Bleeding. Dimpling. Redness. A change in where the nipples are. Feel for changes  Lie on your back on the floor. Feel each breast. To do this, follow these steps: Pick a breast to feel. Put the arm closest to that breast above your head. Use your other arm to feel the nipple area of your breast. Feel the area with the pads of your three middle fingers by making small circles with your  fingers. For the first circle, press lightly. For the second circle, press harder. For the third circle, press even harder. Keep making circles with your fingers at the different pressures as you move down your breast. Stop when you feel your ribs. Move your fingers a little toward the center of your body. Start making circles with your fingers again, this time going up until you reach your collarbone. Keep making up-and-down circles until you reach your armpit. Remember to keep using the three pressures. Feel the other breast in the same way. Sit or stand in the tub or shower. With soapy water on your skin, feel each breast the same way you did in step 2 when you were lying on the floor. Write down what you find Writing down what you find can help you remember what to tell your doctor. Write down: What is normal for each breast. Any changes you find in each breast, including: The kind of changes you find. Whether you have pain. Size and location of any lumps. When you last had your menstrual period. General tips Check your breasts every month. If you are breastfeeding, the best time to check your breasts is after you feed your baby or after you use a breast pump. If you get menstrual periods, the best time to check your breasts is 5-7 days after your menstrual period is over. With time, you will become comfortable with the self-exam, and you will begin to know  if there are changes in your breasts. Contact a doctor if you: See a change in the shape or size of your breasts or nipples. See a change in the skin of your breast or nipples, such as red or scaly skin. Have fluid coming from your nipples that is not normal. Find a lump or thick area that was not there before. Have pain in your breasts. Have any concerns about your breast health. Summary Breast self-awareness includes looking for changes in your breasts, as well as feeling for changes within your breasts. Breast self-awareness  should be done in front of a mirror in a well-lit room. You should check your breasts every month. If you get menstrual periods, the best time to check your breasts is 5-7 days after your menstrual period is over. Let your doctor know of any changes you see in your breasts, including changes in size, changes on the skin, pain or tenderness, or fluid from your nipples that is not normal. This information is not intended to replace advice given to you by your health care provider. Make sure you discuss any questions you have with your health care provider. Document Revised: 07/29/2018 Document Reviewed: 07/29/2018 Elsevier Patient Education  2022 [employer].

## 2021-11-20 ENCOUNTER — Ambulatory Visit (INDEPENDENT_AMBULATORY_CARE_PROVIDER_SITE_OTHER): Payer: Self-pay | Admitting: Surgery

## 2021-11-20 ENCOUNTER — Telehealth: Payer: Self-pay | Admitting: Surgery

## 2021-11-20 ENCOUNTER — Encounter: Payer: Self-pay | Admitting: Surgery

## 2021-11-20 ENCOUNTER — Other Ambulatory Visit: Payer: Self-pay

## 2021-11-20 ENCOUNTER — Telehealth: Payer: Self-pay

## 2021-11-20 VITALS — BP 150/82 | HR 76 | Temp 98.0°F | Ht 64.0 in | Wt 159.2 lb

## 2021-11-20 DIAGNOSIS — N611 Abscess of the breast and nipple: Secondary | ICD-10-CM

## 2021-11-20 MED ORDER — SULFAMETHOXAZOLE-TRIMETHOPRIM 800-160 MG PO TABS: 1.0000 | ORAL_TABLET | Freq: Two times a day (BID) | ORAL | 0 refills | Status: DC

## 2021-11-20 MED ORDER — OXYCODONE HCL 5 MG PO TABS: 5.0000 mg | ORAL_TABLET | Freq: Four times a day (QID) | ORAL | 0 refills | Status: DC | PRN

## 2021-11-20 NOTE — Telephone Encounter (Signed)
Patient is calling and is asking if the one of the nurses will give her a call when you have a moment. Please call patient and advise.

## 2021-11-20 NOTE — Telephone Encounter (Signed)
Right breast abscess and draining-she stated the wound is opening and very uncomfortable.  Mammogram scheduled 11/27/21. Patient requested to be seen-patient added to [place] schedule @ 10:15.

## 2021-11-20 NOTE — Progress Notes (Signed)
11/20/2021  History of Present Illness: Megan Solis is a 45 y.o. female presenting for evaluation of a right breast abscess.  She was seen on 11/10/21 after ED visit for the same.  She never started the antibiotics she was given in the ED due to cost.  Today, she reports that over the weekend, the abscess area worsened and started draining.  She's having increased tenderness associated with the worsening abscess.  Denies any fevers/chills.  Reports that ibuprofen alone is not helping with the pain.    From her last visit, the initial plan was for mammogram/US which was scheduled for 12/5 for evaluation and preoperative planning for eventual right mastectomy.  Past Medical History: Past Medical History:  Diagnosis Date   Abscess of right breast    Alcohol abuse    Hypertension    MRSA (methicillin resistant Staphylococcus aureus)    Pneumonia    Seizures (HCC)    only when coming off alcohol   Tobacco abuse      Past Surgical History: Past Surgical History:  Procedure Laterality Date   INCISION AND DRAINAGE Right    R breast I&D    Home Medications: Prior to Admission medications   Medication Sig Start Date End Date Taking? Authorizing Provider  sulfamethoxazole-trimethoprim (BACTRIM DS) 800-160 MG tablet Take 1 tablet by mouth 2 (two) times daily for 10 days. 11/20/21 11/30/21 Yes Emerson Barretto, Elita Quick, MD    Allergies: No Known Allergies  Review of Systems: Review of Systems  Constitutional:  Negative for chills and fever.  Respiratory:  Negative for shortness of breath.   Cardiovascular:  Negative for chest pain.  Gastrointestinal:  Negative for nausea and vomiting.  Skin:        Right breast abscess/drainage.   Physical Exam BP (!) 150/82   Pulse 76   Temp 98 F (36.7 C) (Oral)   Ht 5\' 4"  (1.626 m)   Wt 159 lb 3.2 oz (72.2 kg)   LMP  (LMP Unknown)   SpO2 97%   BMI 27.33 kg/m  CONSTITUTIONAL: No acute distress HEENT:  Normocephalic, atraumatic, extraocular  motion intact. RESPIRATORY:  Normal respiratory effort without pathologic use of accessory muscles. CARDIOVASCULAR: Regular rhythm and rate. BREAST:  Right breast with small open wound at the 8 o'clock position, no active drainage or fluctuance at this point.  There is some firmness in the retroareolar space at the same level.   NEUROLOGIC:  Motor and sensation is grossly normal.  Cranial nerves are grossly intact. PSYCH:  Alert and oriented to person, place and time. Affect is normal.   Assessment and Plan: This is a 45 y.o. female with recurrent right breast abscess.  --The abscess has drained spontaneously already and there's no significant residual fluctuance or erythema.  At this point, will give the patient a new prescription for Bactrim DS x 10 days, since she could not afford Doxycycline given in the ED.  She will contact 59 if the cost is prohibitive to see if there's another option.  Will also send to her pharmacy a prescription for narcotic for pain control given the worsening abscess. --Patient will follow up with [place] in 1 week to recheck the wound/breast.  If worsening, she will contact us later this week.  We will also push her imaging one week to 12/12 given the recent infection.  I spent 15 minutes dedicated to the care of this patient on the date of this encounter to include pre-visit review of records, face-to-face time with the  patient discussing diagnosis and management, and any post-visit coordination of care.   Howie Ill, MD [facility]

## 2021-11-20 NOTE — Patient Instructions (Addendum)
Please pick up your prescriptions from your pharmacy.    If you have any concerns or questions, please feel free to call our office. See your follow up appointment below.   Mastitis Mastitis is irritation and swelling (inflammation) in an area of the breast. It is caused by an infection that occurs when germs (bacteria) enter the skin. This most often happens to breastfeeding mothers, but it can happen to other women too as well as some men. What are the causes? This condition is caused by: Germs. This can happen when germs enter the breast through cuts or openings in the skin. A plugged milk duct. This happens when something blocks the flow of milk in the breast. Nipple piercing. The pierced area can allow germs to enter the breast. Some types of breast cancer. What are the signs or symptoms? Swelling, redness, and pain in the breast. Swelling of the glands under the arm. Fluid flowing from the nipple. Fever. Chills. Vomiting or feeling like you may vomit. Fast heart rate. Feeling very tired. Headache. Muscle aches. A build-up of pus. This is also called an abscess. How is this treated? This condition may be treated with: Using heat or cold compresses. Taking medicine for pain. Taking antibiotic medicine. Rest. Drinking plenty of fluid. Removing fluid with a needle, if an abscess has developed. Follow these instructions at home: If you are breastfeeding:  Keep emptying your breasts by breastfeeding or by using a breast pump. Ask your doctor if you should make changes to your breast feeding or pumping routine. During breastfeeding, empty the first breast before going to the other breast. Use a breast pump if your baby is not emptying your breasts. Keep your nipples clean and dry. Massage your breasts during feeding or pumping as told by your doctor. If told, put moist heat on the affected area of your breast right before breastfeeding or pumping. Use the heat source that your  doctor tells you to use. If told, put ice on the affected area of your breast right after breastfeeding or pumping. To do this: Put ice in a plastic bag. Place a towel between your skin and the bag. Leave the ice on for 20 minutes. Take off the ice if your skin turns bright red. This is very important. If you cannot feel pain, heat, or cold, you have a greater risk of damage to the area. If you go back to work, pump your breasts while at work. Avoid letting your breasts get overly filled with milk (engorged). Medicines Take over-the-counter and prescription medicines only as told by your doctor. If you were prescribed an antibiotic medicine, take it as told by your doctor. Do not stop taking it even if you start to feel better. General instructions Do not wear a tight or underwire bra. Wear a soft support bra. Drink enough fluid to keep your pee (urine) pale yellow. This is important if you have a fever. Get plenty of rest. Keep all follow-up visits. Contact a doctor if: You have pus-like fluid leaking from your breast. You have a fever. Your symptoms do not get better within 2 days. Get help right away if: Your pain and swelling are getting worse. Your pain is not helped by medicine. You have a red line going from your breast toward your armpit. Summary Mastitis is irritation and swelling in an area of the breast. If you were prescribed an antibiotic medicine, do not stop taking it even if you start to feel better. Drink more fluids  and get plenty of rest. Contact a doctor if your symptoms do not get better within 2 days. This information is not intended to replace advice given to you by your health care provider. Make sure you discuss any questions you have with your health care provider. Document Revised: 10/10/2020 Document Reviewed: 10/10/2020 Elsevier Patient Education  2022 [employer].

## 2021-11-20 NOTE — Telephone Encounter (Signed)
Called [facility] to r/s pt's appt. The next availability was 12/12/2021 @ 2:20 pm. Notified pt. Verbalizes understanding.

## 2021-11-27 ENCOUNTER — Other Ambulatory Visit: Payer: Self-pay

## 2021-11-27 ENCOUNTER — Encounter: Payer: Self-pay | Admitting: Surgery

## 2021-11-27 ENCOUNTER — Ambulatory Visit (INDEPENDENT_AMBULATORY_CARE_PROVIDER_SITE_OTHER): Payer: Self-pay | Admitting: Surgery

## 2021-11-27 VITALS — BP 130/83 | HR 89 | Temp 98.4°F | Ht 65.0 in | Wt 161.8 lb

## 2021-11-27 DIAGNOSIS — N611 Abscess of the breast and nipple: Secondary | ICD-10-CM

## 2021-11-27 NOTE — H&P (View-Only) (Signed)
11/27/2021  History of Present Illness: Megan Solis is a 45 y.o. female presenting for follow-up of a right breast abscess.  She was last seen on 11/12/2021 and was started on a course of Bactrim for a worsening area of an abscess in the right breast.  She reports that this started working initially but the last 2 days, she feels that the area around her nipple has been worsening and getting with more pressure and drainage.  Denies any fevers, chills, chest pain, shortness of breath, nausea, vomiting.  She has been taking the Bactrim as instructed.  Past Medical History: Past Medical History:  Diagnosis Date   Abscess of right breast    Alcohol abuse    Hypertension    MRSA (methicillin resistant Staphylococcus aureus)    Pneumonia    Seizures (HCC)    only when coming off alcohol   Tobacco abuse      Past Surgical History: Past Surgical History:  Procedure Laterality Date   INCISION AND DRAINAGE Right    R breast I&D    Home Medications: Prior to Admission medications   Medication Sig Start Date End Date Taking? Authorizing Provider  oxyCODONE (ROXICODONE) 5 MG immediate release tablet Take 1 tablet (5 mg total) by mouth every 6 (six) hours as needed for severe pain. 11/20/21  Yes Harley Fitzwater, Jacqulyn Bath, MD  sulfamethoxazole-trimethoprim (BACTRIM DS) 800-160 MG tablet Take 1 tablet by mouth 2 (two) times daily for 10 days. 11/20/21 11/30/21 Yes Stefannie Defeo, Jacqulyn Bath, MD    Allergies: No Known Allergies  Review of Systems: Review of Systems  Constitutional:  Negative for chills and fever.  HENT:  Negative for hearing loss.   Respiratory:  Negative for shortness of breath.   Cardiovascular:  Negative for chest pain.  Gastrointestinal:  Negative for abdominal pain, nausea and vomiting.  Genitourinary:  Negative for dysuria.  Musculoskeletal:  Negative for myalgias.  Skin:        Right breast abscess with drainage  Neurological:  Negative for dizziness.  Psychiatric/Behavioral:   Negative for depression.    Physical Exam BP 130/83   Pulse 89   Temp 98.4 F (36.9 C) (Oral)   Ht 5\' 5"  (1.651 m)   Wt 161 lb 12.8 oz (73.4 kg)   LMP  (LMP Unknown)   SpO2 98%   BMI 26.92 kg/m  CONSTITUTIONAL: No acute distress, well-nourished HEENT:  Normocephalic, atraumatic, extraocular motion intact. NECK: Trachea is midline, no jugular venous distention RESPIRATORY:  Lungs are clear, and breath sounds are equal bilaterally. Normal respiratory effort without pathologic use of accessory muscles. CARDIOVASCULAR: Heart is regular without murmurs, gallops, or rubs. BREAST: Right breast with an area of fluctuance at the areola and nipple with tenderness to palpation as well as increased tenderness in the periareolar region at the 9 o'clock position where the prior abscess was located.  No active drainage but I am unable to press to firmly due to discomfort. GI: The abdomen is soft, nondistended, nontender.  MUSCULOSKELETAL: Normal gait, no peripheral edema NEUROLOGIC:  Motor and sensation is grossly normal.  Cranial nerves are grossly intact. PSYCH:  Alert and oriented to person, place and time. Affect is normal.  Labs/Imaging: Labs from 11/05/2021: Sodium 140, potassium 4.4, chloride 107, CO2 27, BUN 13, creatinine 0.69.  LFTs within normal.  Lactic acid 2.1.  WBC 9.1, hemoglobin 14.3, hematocrit 42.3, platelets 326.  Assessment and Plan: This is a 45 y.o. female with a worsening right breast abscess.  - The patient reports  that initially there was some improvement in the right breast abscess with the addition of Bactrim to her regimen.  However over the last 2 days she noticed that this area is getting more tender and swollen.  I think at this point, we would need to proceed with I&D of the right breast abscess.  She would rather do this in the operating room due to discomfort.  Currently we will be able to schedule her for 11/30/2021.  In the meantime, we will start with a new  antibiotic of clindamycin for 7-day course that she will start tonight while in preparation for surgery.  Discussed with her the surgical plan for incision and drainage of the right breast abscess and reviewed the surgery at length with her including the risks of bleeding, infection, injury to surrounding structures, that this would be an outpatient surgery, that this will require packing of the wound as an outpatient.  She is willing to proceed. - The initial plan with her was to try to cool the infection down with antibiotics and proceed in the future with a mastectomy with preoperative mammogram as a precaution.  However at this point given the worsening abscess, discussed with her that this would be a two-stage procedure instead with an I&D first to get rid of the infection followed by mammogram once everything is healed, followed by mastectomy.  She understands this plan is willing to proceed. - She will be scheduled for 11/30/2021.  I spent 40 minutes dedicated to the care of this patient on the date of this encounter to include pre-visit review of records, face-to-face time with the patient discussing diagnosis and management, and any post-visit coordination of care.   Howie Ill, MD [facility]

## 2021-11-27 NOTE — Progress Notes (Signed)
11/27/2021  History of Present Illness: Megan Solis is a 45 y.o. female presenting for follow-up of a right breast abscess.  She was last seen on 11/12/2021 and was started on a course of Bactrim for a worsening area of an abscess in the right breast.  She reports that this started working initially but the last 2 days, she feels that the area around her nipple has been worsening and getting with more pressure and drainage.  Denies any fevers, chills, chest pain, shortness of breath, nausea, vomiting.  She has been taking the Bactrim as instructed.  Past Medical History: Past Medical History:  Diagnosis Date   Abscess of right breast    Alcohol abuse    Hypertension    MRSA (methicillin resistant Staphylococcus aureus)    Pneumonia    Seizures (HCC)    only when coming off alcohol   Tobacco abuse      Past Surgical History: Past Surgical History:  Procedure Laterality Date   INCISION AND DRAINAGE Right    R breast I&D    Home Medications: Prior to Admission medications   Medication Sig Start Date End Date Taking? Authorizing Provider  oxyCODONE (ROXICODONE) 5 MG immediate release tablet Take 1 tablet (5 mg total) by mouth every 6 (six) hours as needed for severe pain. 11/20/21  Yes Whisper Kurka, Jacqulyn Bath, MD  sulfamethoxazole-trimethoprim (BACTRIM DS) 800-160 MG tablet Take 1 tablet by mouth 2 (two) times daily for 10 days. 11/20/21 11/30/21 Yes Finnian Husted, Jacqulyn Bath, MD    Allergies: No Known Allergies  Review of Systems: Review of Systems  Constitutional:  Negative for chills and fever.  HENT:  Negative for hearing loss.   Respiratory:  Negative for shortness of breath.   Cardiovascular:  Negative for chest pain.  Gastrointestinal:  Negative for abdominal pain, nausea and vomiting.  Genitourinary:  Negative for dysuria.  Musculoskeletal:  Negative for myalgias.  Skin:        Right breast abscess with drainage  Neurological:  Negative for dizziness.  Psychiatric/Behavioral:   Negative for depression.    Physical Exam BP 130/83   Pulse 89   Temp 98.4 F (36.9 C) (Oral)   Ht 5\' 5"  (1.651 m)   Wt 161 lb 12.8 oz (73.4 kg)   LMP  (LMP Unknown)   SpO2 98%   BMI 26.92 kg/m  CONSTITUTIONAL: No acute distress, well-nourished HEENT:  Normocephalic, atraumatic, extraocular motion intact. NECK: Trachea is midline, no jugular venous distention RESPIRATORY:  Lungs are clear, and breath sounds are equal bilaterally. Normal respiratory effort without pathologic use of accessory muscles. CARDIOVASCULAR: Heart is regular without murmurs, gallops, or rubs. BREAST: Right breast with an area of fluctuance at the areola and nipple with tenderness to palpation as well as increased tenderness in the periareolar region at the 9 o'clock position where the prior abscess was located.  No active drainage but I am unable to press to firmly due to discomfort. GI: The abdomen is soft, nondistended, nontender.  MUSCULOSKELETAL: Normal gait, no peripheral edema NEUROLOGIC:  Motor and sensation is grossly normal.  Cranial nerves are grossly intact. PSYCH:  Alert and oriented to person, place and time. Affect is normal.  Labs/Imaging: Labs from 11/05/2021: Sodium 140, potassium 4.4, chloride 107, CO2 27, BUN 13, creatinine 0.69.  LFTs within normal.  Lactic acid 2.1.  WBC 9.1, hemoglobin 14.3, hematocrit 42.3, platelets 326.  Assessment and Plan: This is a 45 y.o. female with a worsening right breast abscess.  - The patient reports  that initially there was some improvement in the right breast abscess with the addition of Bactrim to her regimen.  However over the last 2 days she noticed that this area is getting more tender and swollen.  I think at this point, we would need to proceed with I&D of the right breast abscess.  She would rather do this in the operating room due to discomfort.  Currently we will be able to schedule her for 11/30/2021.  In the meantime, we will start with a new  antibiotic of clindamycin for 7-day course that she will start tonight while in preparation for surgery.  Discussed with her the surgical plan for incision and drainage of the right breast abscess and reviewed the surgery at length with her including the risks of bleeding, infection, injury to surrounding structures, that this would be an outpatient surgery, that this will require packing of the wound as an outpatient.  She is willing to proceed. - The initial plan with her was to try to cool the infection down with antibiotics and proceed in the future with a mastectomy with preoperative mammogram as a precaution.  However at this point given the worsening abscess, discussed with her that this would be a two-stage procedure instead with an I&D first to get rid of the infection followed by mammogram once everything is healed, followed by mastectomy.  She understands this plan is willing to proceed. - She will be scheduled for 11/30/2021.  I spent 40 minutes dedicated to the care of this patient on the date of this encounter to include pre-visit review of records, face-to-face time with the patient discussing diagnosis and management, and any post-visit coordination of care.   Howie Ill, MD [facility]

## 2021-11-27 NOTE — Patient Instructions (Signed)
Our surgery scheduler will call you within 24-48 hours to schedule your surgery. Please have the Blue surgery sheet available when speaking with her.  Please take the written prescription to [facility] with the good RX card for cost quote of the Antibiotic.   We will cancel the mammogram.

## 2021-11-28 ENCOUNTER — Telehealth: Payer: Self-pay | Admitting: Surgery

## 2021-11-28 NOTE — Telephone Encounter (Signed)
Patient has been advised of Pre-Admission date/time, COVID Testing date and Surgery date.  Surgery Date: 11/30/21 Preadmission Testing Date: 11/29/21 (phone 1p-5p) Covid Testing Date: Not needed.     Patient has been made aware to call [phone], between 1-3:00pm the day before surgery, to find out what time to arrive for surgery.

## 2021-11-29 ENCOUNTER — Other Ambulatory Visit: Payer: Self-pay

## 2021-11-29 ENCOUNTER — Other Ambulatory Visit
Admission: RE | Admit: 2021-11-29 | Discharge: 2021-11-29 | Disposition: A | Discharge status: Home / Self Care | Payer: Self-pay | Source: Ambulatory Visit | Attending: Surgery | Admitting: Surgery

## 2021-11-29 NOTE — Patient Instructions (Addendum)
Your procedure is scheduled on: 11/30/21  Report to the Registration Desk on the 1st floor of the Medical Mall. To find out your arrival time, please call [phone] between 1PM - 3PM on: 11:30 am  REMEMBER: Instructions that are not followed completely may result in serious medical risk, up to and including death; or upon the discretion of your surgeon and anesthesiologist your surgery may need to be rescheduled.  Do not eat food after midnight the night before surgery.  No gum chewing, lozengers or hard candies.  You may however, drink CLEAR liquids up to 2 hours before you are scheduled to arrive for your surgery. Do not drink anything within 2 hours of your scheduled arrival time.  Clear liquids include: - water  - apple juice without pulp - gatorade (not RED, PURPLE, OR BLUE) - black coffee or tea (Do NOT add milk or creamers to the coffee or tea) Do NOT drink anything that is not on this list.  TAKE THESE MEDICATIONS THE MORNING OF SURGERY WITH A SIP OF WATER: - clindamycin (CLEOCIN) 150 MG capsule - omeprazole (PRILOSEC) 20 MG capsule,   One week prior to surgery: Stop Anti-inflammatories (NSAIDS) such as Advil, Aleve, Ibuprofen, Motrin, Naproxen, Naprosyn and Aspirin based products such as Excedrin, Goodys Powder, BC Powder.  Stop ANY OVER THE COUNTER supplements until after surgery.  You may however, continue to take Tylenol if needed for pain up until the day of surgery.  No Alcohol for 24 hours before or after surgery.  No Smoking including e-cigarettes for 24 hours prior to surgery.  No chewable tobacco products for at least 6 hours prior to surgery.  No nicotine patches on the day of surgery.  Do not use any "recreational" drugs for at least a week prior to your surgery.  Please be advised that the combination of cocaine and anesthesia may have negative outcomes, up to and including death. If you test positive for cocaine, your surgery will be cancelled.  On  the morning of surgery brush your teeth with toothpaste and water, you may rinse your mouth with mouthwash if you wish. Do not swallow any toothpaste or mouthwash.  Take a fresh shower the morning of your surgery .  Do not wear jewelry, make-up, hairpins, clips or nail polish.  Do not wear/apply lotions, powders, deodorant or perfumes.   Do not shave body from the neck down 48 hours prior to surgery just in case you cut yourself which could leave a site for infection.  Also, freshly shaved skin may become irritated if using the CHG soap.  Contact lenses, hearing aids and dentures may not be worn into surgery.  Do not bring valuables to the hospital. [facility] is not responsible for any missing/lost belongings or valuables.   Notify your doctor if there is any change in your medical condition (cold, fever, infection).  Wear comfortable clothing (specific to your surgery type) to the hospital.  After surgery, you can help prevent lung complications by doing breathing exercises.  Take deep breaths and cough every 1-2 hours. Your doctor may order a device called an Incentive Spirometer to help you take deep breaths. When coughing or sneezing, hold a pillow firmly against your incision with both hands. This is called "splinting." Doing this helps protect your incision. It also decreases belly discomfort.  If you are being admitted to the hospital overnight, leave your suitcase in the car. After surgery it may be brought to your room.  If you are  being discharged the day of surgery, you will not be allowed to drive home. You will need a responsible adult (18 years or older) to drive you home and stay with you that night.   If you are taking public transportation, you will need to have a responsible adult (18 years or older) with you. Please confirm with your physician that it is acceptable to use public transportation.   Please call the Pre-admissions Testing Dept. at [phone] if  you have any questions about these instructions.  Surgery Visitation Policy:  Patients undergoing a surgery or procedure may have one family member or support person with them as long as that person is not COVID-19 positive or experiencing its symptoms.  That person may remain in the waiting area during the procedure and may rotate out with other people.  Inpatient Visitation:    Visiting hours are 7 a.m. to 8 p.m. Up to two visitors ages 16+ are allowed at one time in a patient room. The visitors may rotate out with other people during the day. Visitors must check out when they leave, or other visitors will not be allowed. One designated support person may remain overnight. The visitor must pass COVID-19 screenings, use hand sanitizer when entering and exiting the patient's room and wear a mask at all times, including in the patient's room. Patients must also wear a mask when staff or their visitor are in the room. Masking is required regardless of vaccination status.

## 2021-11-30 ENCOUNTER — Encounter: Payer: Self-pay | Admitting: Surgery

## 2021-11-30 ENCOUNTER — Ambulatory Visit: Payer: Self-pay | Admitting: Anesthesiology

## 2021-11-30 ENCOUNTER — Other Ambulatory Visit: Payer: Self-pay

## 2021-11-30 ENCOUNTER — Ambulatory Visit: Payer: Self-pay | Admitting: Urgent Care

## 2021-11-30 ENCOUNTER — Encounter
Admission: RE | Disposition: A | Discharge status: Home / Self Care | Payer: Self-pay | Source: Home / Self Care | Attending: Surgery

## 2021-11-30 ENCOUNTER — Ambulatory Visit
Admission: RE | Admit: 2021-11-30 | Discharge: 2021-11-30 | Disposition: A | Discharge status: Home / Self Care | Payer: Self-pay | Attending: Surgery | Admitting: Surgery

## 2021-11-30 DIAGNOSIS — N611 Abscess of the breast and nipple: Secondary | ICD-10-CM

## 2021-11-30 HISTORY — PX: INCISION AND DRAINAGE ABSCESS: SHX5864

## 2021-11-30 LAB — POCT PREGNANCY, URINE: Preg Test, Ur: NEGATIVE

## 2021-11-30 SURGERY — INCISION AND DRAINAGE, ABSCESS
Anesthesia: General | Site: Breast | Laterality: Right

## 2021-11-30 MED ORDER — ACETAMINOPHEN 500 MG PO TABS: 1000.0000 mg | ORAL_TABLET | Freq: Four times a day (QID) | ORAL | Status: DC | PRN

## 2021-11-30 MED ORDER — CEFAZOLIN SODIUM-DEXTROSE 2-4 GM/100ML-% IV SOLN
2.0000 g | INTRAVENOUS | Status: AC
  Administered 2021-11-30: 2 g via INTRAVENOUS

## 2021-11-30 MED ORDER — DEXMEDETOMIDINE HCL IN NACL 400 MCG/100ML IV SOLN
INTRAVENOUS | Status: DC | PRN
  Administered 2021-11-30: 8 ug via INTRAVENOUS
  Administered 2021-11-30: 20 ug via INTRAVENOUS

## 2021-11-30 MED ORDER — BUPIVACAINE LIPOSOME 1.3 % IJ SUSP: 20.0000 mL | Freq: Once | INTRAMUSCULAR | Status: DC

## 2021-11-30 MED ORDER — GABAPENTIN 300 MG PO CAPS
ORAL_CAPSULE | ORAL | Status: AC
  Administered 2021-11-30: 300 mg via ORAL
  Filled 2021-11-30: qty 1

## 2021-11-30 MED ORDER — OXYCODONE HCL 5 MG/5ML PO SOLN: 5.0000 mg | Freq: Once | ORAL | Status: AC | PRN

## 2021-11-30 MED ORDER — FENTANYL CITRATE (PF) 100 MCG/2ML IJ SOLN
INTRAMUSCULAR | Status: AC
  Filled 2021-11-30: qty 2

## 2021-11-30 MED ORDER — MIDAZOLAM HCL 2 MG/2ML IJ SOLN
INTRAMUSCULAR | Status: AC
  Filled 2021-11-30: qty 2

## 2021-11-30 MED ORDER — ORAL CARE MOUTH RINSE: 15.0000 mL | Freq: Once | OROMUCOSAL | Status: DC

## 2021-11-30 MED ORDER — EPHEDRINE 5 MG/ML INJ
INTRAVENOUS | Status: AC
  Filled 2021-11-30: qty 5

## 2021-11-30 MED ORDER — BUPIVACAINE-EPINEPHRINE (PF) 0.25% -1:200000 IJ SOLN
INTRAMUSCULAR | Status: DC | PRN
  Administered 2021-11-30: 40 mL via INTRAMUSCULAR

## 2021-11-30 MED ORDER — OXYCODONE HCL 5 MG PO TABS
5.0000 mg | ORAL_TABLET | Freq: Once | ORAL | Status: AC | PRN
  Administered 2021-11-30: 5 mg via ORAL

## 2021-11-30 MED ORDER — STERILE WATER FOR IRRIGATION IR SOLN
Status: DC | PRN
  Administered 2021-11-30: 1000 mL

## 2021-11-30 MED ORDER — BUPIVACAINE LIPOSOME 1.3 % IJ SUSP
INTRAMUSCULAR | Status: AC
  Filled 2021-11-30: qty 10

## 2021-11-30 MED ORDER — FENTANYL CITRATE (PF) 100 MCG/2ML IJ SOLN
INTRAMUSCULAR | Status: AC
  Administered 2021-11-30: 50 ug via INTRAVENOUS
  Filled 2021-11-30: qty 2

## 2021-11-30 MED ORDER — ONDANSETRON HCL 4 MG/2ML IJ SOLN
INTRAMUSCULAR | Status: AC
  Filled 2021-11-30: qty 2

## 2021-11-30 MED ORDER — KETAMINE HCL 50 MG/5ML IJ SOSY
PREFILLED_SYRINGE | INTRAMUSCULAR | Status: AC
  Filled 2021-11-30: qty 5

## 2021-11-30 MED ORDER — CEFAZOLIN SODIUM-DEXTROSE 2-4 GM/100ML-% IV SOLN
INTRAVENOUS | Status: AC
  Filled 2021-11-30: qty 100

## 2021-11-30 MED ORDER — LIDOCAINE HCL (CARDIAC) PF 100 MG/5ML IV SOSY
PREFILLED_SYRINGE | INTRAVENOUS | Status: DC | PRN
  Administered 2021-11-30: 80 mg via INTRAVENOUS

## 2021-11-30 MED ORDER — DEXAMETHASONE SODIUM PHOSPHATE 10 MG/ML IJ SOLN
INTRAMUSCULAR | Status: AC
  Filled 2021-11-30: qty 1

## 2021-11-30 MED ORDER — CHLORHEXIDINE GLUCONATE 0.12 % MT SOLN: 15.0000 mL | Freq: Once | OROMUCOSAL | Status: DC

## 2021-11-30 MED ORDER — IBUPROFEN 800 MG PO TABS: 800.0000 mg | ORAL_TABLET | Freq: Three times a day (TID) | ORAL | 1 refills | Status: DC | PRN

## 2021-11-30 MED ORDER — PROPOFOL 10 MG/ML IV BOLUS
INTRAVENOUS | Status: AC
  Filled 2021-11-30: qty 20

## 2021-11-30 MED ORDER — EPHEDRINE SULFATE-NACL 50-0.9 MG/10ML-% IV SOSY
PREFILLED_SYRINGE | INTRAVENOUS | Status: DC | PRN
  Administered 2021-11-30 (×2): 10 mg via INTRAVENOUS
  Administered 2021-11-30: 5 mg via INTRAVENOUS

## 2021-11-30 MED ORDER — CHLORHEXIDINE GLUCONATE 0.12 % MT SOLN
OROMUCOSAL | Status: AC
  Filled 2021-11-30: qty 15

## 2021-11-30 MED ORDER — PROPOFOL 10 MG/ML IV BOLUS
INTRAVENOUS | Status: DC | PRN
  Administered 2021-11-30: 150 mg via INTRAVENOUS
  Administered 2021-11-30: 50 mg via INTRAVENOUS
  Administered 2021-11-30: 100 mg via INTRAVENOUS

## 2021-11-30 MED ORDER — MIDAZOLAM HCL 2 MG/2ML IJ SOLN
INTRAMUSCULAR | Status: DC | PRN
  Administered 2021-11-30: 2 mg via INTRAVENOUS

## 2021-11-30 MED ORDER — KETOROLAC TROMETHAMINE 30 MG/ML IJ SOLN
INTRAMUSCULAR | Status: DC | PRN
  Administered 2021-11-30: 30 mg via INTRAVENOUS

## 2021-11-30 MED ORDER — OXYCODONE HCL 5 MG PO TABS
ORAL_TABLET | ORAL | Status: AC
  Filled 2021-11-30: qty 1

## 2021-11-30 MED ORDER — GABAPENTIN 300 MG PO CAPS: 300.0000 mg | ORAL_CAPSULE | ORAL | Status: AC

## 2021-11-30 MED ORDER — FENTANYL CITRATE (PF) 100 MCG/2ML IJ SOLN: 25.0000 ug | INTRAMUSCULAR | Status: DC | PRN

## 2021-11-30 MED ORDER — CHLORHEXIDINE GLUCONATE CLOTH 2 % EX PADS: 6.0000 | MEDICATED_PAD | Freq: Once | CUTANEOUS | Status: DC

## 2021-11-30 MED ORDER — FENTANYL CITRATE (PF) 100 MCG/2ML IJ SOLN
INTRAMUSCULAR | Status: DC | PRN
  Administered 2021-11-30: 50 ug via INTRAVENOUS
  Administered 2021-11-30 (×2): 25 ug via INTRAVENOUS

## 2021-11-30 MED ORDER — LACTATED RINGERS IV SOLN: INTRAVENOUS | Status: DC

## 2021-11-30 MED ORDER — KETOROLAC TROMETHAMINE 30 MG/ML IJ SOLN
INTRAMUSCULAR | Status: AC
  Filled 2021-11-30: qty 1

## 2021-11-30 MED ORDER — OXYCODONE HCL 5 MG PO TABS: 5.0000 mg | ORAL_TABLET | ORAL | 0 refills | Status: DC | PRN

## 2021-11-30 MED ORDER — ACETAMINOPHEN 10 MG/ML IV SOLN: 1000.0000 mg | Freq: Once | INTRAVENOUS | Status: DC | PRN

## 2021-11-30 MED ORDER — KETAMINE HCL 10 MG/ML IJ SOLN
INTRAMUSCULAR | Status: DC | PRN
  Administered 2021-11-30: 10 mg via INTRAVENOUS
  Administered 2021-11-30: 20 mg via INTRAVENOUS

## 2021-11-30 MED ORDER — PROMETHAZINE HCL 25 MG/ML IJ SOLN: 6.2500 mg | INTRAMUSCULAR | Status: DC | PRN

## 2021-11-30 MED ORDER — ONDANSETRON HCL 4 MG/2ML IJ SOLN
INTRAMUSCULAR | Status: DC | PRN
  Administered 2021-11-30: 4 mg via INTRAVENOUS

## 2021-11-30 MED ORDER — DEXAMETHASONE SODIUM PHOSPHATE 10 MG/ML IJ SOLN
INTRAMUSCULAR | Status: DC | PRN
  Administered 2021-11-30: 5 mg via INTRAVENOUS

## 2021-11-30 MED ORDER — ACETAMINOPHEN 500 MG PO TABS
ORAL_TABLET | ORAL | Status: AC
  Administered 2021-11-30: 1000 mg via ORAL
  Filled 2021-11-30: qty 2

## 2021-11-30 MED ORDER — ACETAMINOPHEN 500 MG PO TABS: 1000.0000 mg | ORAL_TABLET | ORAL | Status: AC

## 2021-11-30 MED ORDER — BUPIVACAINE-EPINEPHRINE (PF) 0.25% -1:200000 IJ SOLN
INTRAMUSCULAR | Status: AC
  Filled 2021-11-30: qty 30

## 2021-11-30 SURGICAL SUPPLY — 31 items
BLADE SURG 15 STRL LF DISP TIS (BLADE) ×1 IMPLANT
BLADE SURG 15 STRL SS (BLADE) ×2
DRAIN PENROSE 12X.25 LTX STRL (MISCELLANEOUS) IMPLANT
DRAIN PENROSE 5/8X18 LTX STRL (DRAIN) IMPLANT
DRAPE LAPAROTOMY 77X122 PED (DRAPES) ×2 IMPLANT
ELECT CAUTERY BLADE 6.4 (BLADE) IMPLANT
ELECT REM PT RETURN 9FT ADLT (ELECTROSURGICAL) ×2
ELECTRODE REM PT RTRN 9FT ADLT (ELECTROSURGICAL) ×1 IMPLANT
GAUZE 4X4 16PLY ~~LOC~~+RFID DBL (SPONGE) ×2 IMPLANT
GAUZE PACKING IODOFORM 1/2 (PACKING) ×2 IMPLANT
GAUZE SPONGE 4X4 12PLY STRL (GAUZE/BANDAGES/DRESSINGS) IMPLANT
GLOVE SURG ORTHO LTX SZ7.5 (GLOVE) ×2 IMPLANT
GOWN STRL REUS W/ TWL LRG LVL3 (GOWN DISPOSABLE) ×2 IMPLANT
GOWN STRL REUS W/TWL LRG LVL3 (GOWN DISPOSABLE) ×4
KIT TURNOVER KIT A (KITS) ×2 IMPLANT
MANIFOLD NEPTUNE II (INSTRUMENTS) ×2 IMPLANT
NEEDLE HYPO 22GX1.5 SAFETY (NEEDLE) ×2 IMPLANT
NS IRRIG 1000ML POUR BTL (IV SOLUTION) ×2 IMPLANT
PACK BASIN MINOR ARMC (MISCELLANEOUS) ×2 IMPLANT
PAD ABD DERMACEA PRESS 5X9 (GAUZE/BANDAGES/DRESSINGS) IMPLANT
SOL PREP PVP 2OZ (MISCELLANEOUS) ×2
SOLUTION PREP PVP 2OZ (MISCELLANEOUS) ×1 IMPLANT
SPONGE T-LAP 18X18 ~~LOC~~+RFID (SPONGE) IMPLANT
SUT ETHILON 3-0 FS-10 30 BLK (SUTURE)
SUT VIC AB 3-0 SH 27 (SUTURE) ×2
SUT VIC AB 3-0 SH 27X BRD (SUTURE) ×1 IMPLANT
SUTURE EHLN 3-0 FS-10 30 BLK (SUTURE) IMPLANT
SWAB CULTURE AMIES ANAERIB BLU (MISCELLANEOUS) ×2 IMPLANT
SYR 10ML LL (SYRINGE) ×2 IMPLANT
SYR BULB IRRIG 60ML STRL (SYRINGE) ×2 IMPLANT
WATER STERILE IRR 500ML POUR (IV SOLUTION) ×2 IMPLANT

## 2021-11-30 NOTE — Anesthesia Preprocedure Evaluation (Addendum)
Anesthesia Evaluation  Patient identified by MRN, date of birth, ID band Patient awake    Reviewed: Allergy & Precautions, H&P , NPO status , Patient's Chart, lab work & pertinent test results  Airway Mallampati: II  TM Distance: >3 FB Neck ROM: Full    Dental  (+) Missing, Poor Dentition, Edentulous Upper,    Pulmonary Current SmokerPatient did not abstain from smoking.,    Pulmonary exam normal        Cardiovascular Exercise Tolerance: Good hypertension, Normal cardiovascular exam     Neuro/Psych Seizures - (remotely after withdrawal from alcohol), Well Controlled,  negative psych ROS   GI/Hepatic Neg liver ROS, GERD  ,  Endo/Other  negative endocrine ROS  Renal/GU negative Renal ROS  negative genitourinary   Musculoskeletal negative musculoskeletal ROS (+)   Abdominal Normal abdominal exam  (+)   Peds negative pediatric ROS (+)  Hematology negative hematology ROS (+)   Anesthesia Other Findings Right breast abscess  Reproductive/Obstetrics negative OB ROS                            Anesthesia Physical Anesthesia Plan  ASA: 2  Anesthesia Plan: General   Post-op Pain Management:    Induction: Intravenous  PONV Risk Score and Plan: 2 and Ondansetron, Dexamethasone and Midazolam  Airway Management Planned: LMA  Additional Equipment:   Intra-op Plan:   Post-operative Plan: Extubation in OR  Informed Consent: I have reviewed the patients History and Physical, chart, labs and discussed the procedure including the risks, benefits and alternatives for the proposed anesthesia with the patient or authorized representative who has indicated his/her understanding and acceptance.     Dental advisory given  Plan Discussed with: CRNA and Anesthesiologist  Anesthesia Plan Comments:        Anesthesia Quick Evaluation

## 2021-11-30 NOTE — Op Note (Signed)
  Procedure Date:  11/30/2021  Pre-operative Diagnosis:  Recurrent right breast abscess  Post-operative Diagnosis: Recurrent right breast abscess  Procedure:  Incision and Drainage of right breast abscess  Surgeon:  Howie Ill, MD  Anesthesia:  General endotracheal  Estimated Blood Loss:  5 ml  Specimens:  Right breast abscess culture swab Right breast abscess tissue culture  Complications:  None  Indications for Procedure:  This is a 45 y.o. female with diagnosis of a recurrent right breast abscess, requiring drainage procedure.  The risks of bleeding, abscess or infection, injury to surrounding structures, and need for further procedures were all discussed with the patient and was willing to proceed.  Description of Procedure: The patient was correctly identified in the preoperative area and brought into the operating room.  The patient was placed supine with VTE prophylaxis in place.  Appropriate time-outs were performed.  Anesthesia was induced and the patient was intubated.  Appropriate antibiotics were infused.  The patient's right was prepped and draped in usual sterile fashion.  Intraoperative ultrasound was used to evaluate the right breast and an area of abscess and scarring was found in the retroareolar region in the lower outer quadrant at the areola.  A inferolateral periareolar incision was made over the abscess, revealing minimal purulent fluid.  This fluid was swabbed for culture and sent to micro.  There was scarring from her prior abscesses and I&D procedures, and this scar was resected and sent for tissue culture as well.  After drainage was completed, the cavity was irrigated and cleaned.  The deeper end of the wound was closed with 3-0 Vicryl suture.  The wound was then packed with 1/2 inch iodoform gauze and covered with dry gauze and tape.  The patient was then emerged from anesthesia, extubated, and brought to the recovery room for further management.  The  patient tolerated the procedure well and all counts were correct at the end of the case.   Howie Ill, MD

## 2021-11-30 NOTE — Discharge Instructions (Addendum)
Please watch this video to help with instructions for the wound packing:  [email or web address]  If you have any questions, please call our office at [phone] SURGERY  DISCHARGE INSTRUCTIONS   The drugs that you were given will stay in your system until tomorrow so for the next 24 hours you should not:  Drive an automobile Make any legal decisions Drink any alcoholic beverage   You may resume regular meals tomorrow.  Today it is better to start with liquids and gradually work up to solid foods.  You may eat anything you prefer, but it is better to start with liquids, then soup and crackers, and gradually work up to solid foods.   Please notify your doctor immediately if you have any unusual bleeding, trouble breathing, redness and pain at the surgery site, drainage, fever, or pain not relieved by medication.    Additional Instructions:   Please contact your physician with any problems or Same Day Surgery at [phone], Monday through Friday 6 am to 4 pm, or [place] at [facility] number at [phone].

## 2021-11-30 NOTE — Interval H&P Note (Signed)
History and Physical Interval Note:  11/30/2021 11:31 AM  Megan Solis  has presented today for surgery, with the diagnosis of right breast abscess.  The various methods of treatment have been discussed with the patient and family. After consideration of risks, benefits and other options for treatment, the patient has consented to  Procedure(s) with comments: INCISION AND DRAINAGE ABSCESS, right breast abscess THIS CARD COPIED FROM ANOTHER SURGEON - PLEASE MAKE CHANGES (Right) - Provider requesting 1.5 hours / 90 minutes for procedure. as a surgical intervention.  The patient's history has been reviewed, patient examined, no change in status, stable for surgery.  I have reviewed the patient's chart and labs.  Questions were answered to the patient's satisfaction.     Ivorie Uplinger

## 2021-11-30 NOTE — Anesthesia Postprocedure Evaluation (Signed)
Anesthesia Post Note  Patient: Megan Solis  Procedure(s) Performed: INCISION AND DRAINAGE ABSCESS, right breast abscess (Right: Breast)  Patient location during evaluation: PACU Anesthesia Type: General Level of consciousness: awake and alert Pain management: pain level controlled Vital Signs Assessment: post-procedure vital signs reviewed and stable Respiratory status: spontaneous breathing, nonlabored ventilation and respiratory function stable Cardiovascular status: blood pressure returned to baseline and stable Postop Assessment: no apparent nausea or vomiting Anesthetic complications: no   No notable events documented.   Last Vitals:  Vitals:   11/30/21 1400 11/30/21 1415  BP: (!) 144/88 (!) 165/97  Pulse: 91 83  Resp:  18  Temp:  36.4 C  SpO2: 97% 97%    Last Pain:  Vitals:   11/30/21 1415  TempSrc: Oral  PainSc: 3                  Foye Deer

## 2021-11-30 NOTE — Anesthesia Procedure Notes (Signed)
Procedure Name: LMA Insertion Date/Time: 11/30/2021 12:14 PM Performed by: Stormy Fabian, CRNA Pre-anesthesia Checklist: Patient identified, Patient being monitored, Timeout performed, Emergency Drugs available and Suction available Patient Re-evaluated:Patient Re-evaluated prior to induction Oxygen Delivery Method: Circle system utilized Preoxygenation: Pre-oxygenation with 100% oxygen Induction Type: IV induction Ventilation: Mask ventilation without difficulty LMA: LMA inserted LMA Size: 3.5 Tube type: Oral Number of attempts: 1 Placement Confirmation: positive ETCO2 and breath sounds checked- equal and bilateral Tube secured with: Tape Dental Injury: Teeth and Oropharynx as per pre-operative assessment

## 2021-11-30 NOTE — Transfer of Care (Signed)
Immediate Anesthesia Transfer of Care Note  Patient: Megan Solis  Procedure(s) Performed: INCISION AND DRAINAGE ABSCESS, right breast abscess (Right: Breast)  Patient Location: PACU  Anesthesia Type:General  Level of Consciousness: drowsy  Airway & Oxygen Therapy: Patient Spontanous Breathing  Post-op Assessment: Report given to RN  Post vital signs: stable  Last Vitals:  Vitals Value Taken Time  BP    Temp    Pulse    Resp    SpO2      Last Pain:  Vitals:   11/30/21 1120  TempSrc: Temporal  PainSc: 4       Patients Stated Pain Goal: 3 (11/30/21 1120)  Complications: No notable events documented.

## 2021-12-04 ENCOUNTER — Encounter: Payer: Self-pay | Admitting: Surgery

## 2021-12-04 ENCOUNTER — Ambulatory Visit (INDEPENDENT_AMBULATORY_CARE_PROVIDER_SITE_OTHER): Payer: Self-pay | Admitting: Surgery

## 2021-12-04 ENCOUNTER — Other Ambulatory Visit: Payer: Self-pay

## 2021-12-04 VITALS — BP 172/110 | HR 96 | Temp 97.9°F | Ht 65.0 in | Wt 161.0 lb

## 2021-12-04 DIAGNOSIS — N611 Abscess of the breast and nipple: Secondary | ICD-10-CM

## 2021-12-04 DIAGNOSIS — Z09 Encounter for follow-up examination after completed treatment for conditions other than malignant neoplasm: Secondary | ICD-10-CM

## 2021-12-04 MED ORDER — OXYCODONE HCL 5 MG PO TABS: 5.0000 mg | ORAL_TABLET | ORAL | 0 refills | Status: DC | PRN

## 2021-12-04 MED ORDER — LIDOCAINE 5 % EX OINT: 1.0000 "application " | TOPICAL_OINTMENT | CUTANEOUS | 1 refills | Status: DC | PRN

## 2021-12-04 NOTE — Patient Instructions (Signed)
Apply small amount to the nipple area and around the wound 15 minutes prior to removing old packing. Please see your follow up appointment listed below.

## 2021-12-04 NOTE — Addendum Note (Signed)
Addended by: Myrtie Hawk on: 12/04/2021 04:56 PM   Modules accepted: Orders

## 2021-12-04 NOTE — Progress Notes (Signed)
12/04/2021  HPI: Megan Solis is a 45 y.o. female s/p I&D of right breast abscess on 11/30/21.  She presented today because of significant right breast pain.  Denies any fevers, chills, chest pain, shortness of breath, nausea, or vomiting.  However, the incision area remains painful and the oxycodone and ibuprofen are not helping well.  Has not changed her dressing this morning due to pain.  Vital signs: BP (!) 172/110   Pulse 96   Temp 97.9 F (36.6 C) (Oral)   Ht 5\' 5"  (1.651 m)   Wt 161 lb (73 kg)   LMP  (LMP Unknown)   BMI 26.79 kg/m    Physical Exam: Constitutional:  No acute distress Breast:  Right breast I&D site at the inferolateral aspect of the right areola is overall healing well.  The patient was very tender so we applied 4% lidocaine over the surrounding skin and let it sit for 10 minutes.  Then the packing dressing was removed.  The wound edges are clean, healing well, without any necrosis or purulence or bleeding.  New 1/2 inch iodoform gauze was packed, covered with dry gauze and tape.    Assessment/Plan: This is a 45 y.o. female s/p I&D of right breast abscess  --Discussed with the patient and her boyfriend about how to do the dressing changes and how to be gentler with the gauze so hopefully it does not hurt as much.  The wound is healing appropriately without any evidence of infection or necrosis.  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  This should improve as the wound heals. --Will refill the patient's oxycodone prescription and will also give her a prescription for Lidocaine 5% ointment to help with pain and dressing changes. --Follow up next week for wound check.   60, MD  [facility]

## 2021-12-05 LAB — AEROBIC/ANAEROBIC CULTURE W GRAM STAIN (SURGICAL/DEEP WOUND)
Culture: NO GROWTH
Culture: NO GROWTH
Gram Stain: NONE SEEN
Gram Stain: NONE SEEN

## 2021-12-11 ENCOUNTER — Encounter: Payer: Self-pay | Admitting: Surgery

## 2021-12-11 ENCOUNTER — Ambulatory Visit (INDEPENDENT_AMBULATORY_CARE_PROVIDER_SITE_OTHER): Payer: Self-pay | Admitting: Surgery

## 2021-12-11 ENCOUNTER — Other Ambulatory Visit: Payer: Self-pay

## 2021-12-11 VITALS — BP 184/113 | HR 80 | Temp 98.7°F | Ht 65.0 in | Wt 165.4 lb

## 2021-12-11 DIAGNOSIS — N611 Abscess of the breast and nipple: Secondary | ICD-10-CM

## 2021-12-11 DIAGNOSIS — Z09 Encounter for follow-up examination after completed treatment for conditions other than malignant neoplasm: Secondary | ICD-10-CM

## 2021-12-11 MED ORDER — GABAPENTIN 300 MG PO CAPS: 300.0000 mg | ORAL_CAPSULE | Freq: Three times a day (TID) | ORAL | 0 refills | Status: DC

## 2021-12-11 NOTE — Patient Instructions (Signed)
Please pick up your prescription at your pharmacy.  If you have any concerns or questions, please feel free to call our office.   Incision and Drainage, Care After This sheet gives you information about how to care for yourself after your procedure. Your health care provider may also give you more specific instructions. If you have problems or questions, contact your health care provider. What can I expect after the procedure? After the procedure, it is common to have: Pain or discomfort around the incision site. Blood, fluid, or pus (drainage) from the incision. Redness and firm skin around the incision site. Follow these instructions at home: Medicines Take over-the-counter and prescription medicines only as told by your health care provider. If you were prescribed an antibiotic medicine, use or take it as told by your health care provider. Do not stop using the antibiotic even if you start to feel better. Wound care Follow instructions from your health care provider about how to take care of your wound. Make sure you: Wash your hands with soap and water before and after you change your bandage (dressing). If soap and water are not available, use hand sanitizer. Change your dressing and packing as told by your health care provider. If your dressing is dry or stuck when you try to remove it, moisten or wet the dressing with saline or water so that it can be removed without harming your skin or tissues. If your wound is packed, leave it in place until your health care provider tells you to remove it. To remove the packing, moisten or wet the packing with saline or water so that it can be removed without harming your skin or tissues. Leave stitches (sutures), skin glue, or adhesive strips in place. These skin closures may need to stay in place for 2 weeks or longer. If adhesive strip edges start to loosen and curl up, you may trim the loose edges. Do not remove adhesive strips completely unless  your health care provider tells you to do that. Check your wound every day for signs of infection. Check for: More redness, swelling, or pain. More fluid or blood. Warmth. Pus or a bad smell. If you were sent home with a drain tube in place, follow instructions from your health care provider about: How to empty it. How to care for it at home.  General instructions Rest the affected area. Do not take baths, swim, or use a hot tub until your health care provider approves. Ask your health care provider if you may take showers. You may only be allowed to take sponge baths. Return to your normal activities as told by your health care provider. Ask your health care provider what activities are safe for you. Your health care provider may put you on activity or lifting restrictions. The incision will continue to drain. It is normal to have some clear or slightly bloody drainage. The amount of drainage should lessen each day. Do not apply any creams, ointments, or liquids unless you have been told to by your health care provider. Keep all follow-up visits as told by your health care provider. This is important. Contact a health care provider if: Your cyst or abscess returns. You have a fever or chills. You have more redness, swelling, or pain around your incision. You have more fluid or blood coming from your incision. Your incision feels warm to the touch. You have pus or a bad smell coming from your incision. You have red streaks above or below the  incision site. Get help right away if: You have severe pain or bleeding. You cannot eat or drink without vomiting. You have decreased urine output. You become short of breath. You have chest pain. You cough up blood. The affected area becomes numb or starts to tingle. These symptoms may represent a serious problem that is an emergency. Do not wait to see if the symptoms will go away. Get medical help right away. Call your local emergency services  (911 in the [place] Do not drive yourself to the hospital. Summary After this procedure, it is common to have fluid, blood, or pus coming from the surgery site. Follow all home care instructions. You will be told how to take care of your incision, how to check for infection, and how to take medicines. If you were prescribed an antibiotic medicine, take it as told by your health care provider. Do not stop taking the antibiotic even if you start to feel better. Contact a health care provider if you have increased redness, swelling, or pain around your incision. Get help right away if you have chest pain, you vomit, you cough up blood, or you have shortness of breath. Keep all follow-up visits as told by your health care provider. This is important. This information is not intended to replace advice given to you by your health care provider. Make sure you discuss any questions you have with your health care provider. Document Revised: 11/10/2018 Document Reviewed: 11/10/2018 Elsevier Patient Education  2022 [employer].

## 2021-12-11 NOTE — Progress Notes (Signed)
12/11/2021  HPI: Megan Solis is a 45 y.o. female s/p right breast I&D for recurrent right breast abscess on 11/30/2021.  She presents today for follow-up.  She was last seen last week due to persistent or worsening pain in the right breast.  She was given lidocaine ointment prescription but the patient reports that this was too expensive and did not fill this.  She has continued doing packing dressing changes and denies any troubles with the incision except the pain that is mildly improving and concerns that the wound does not getting smaller yet.  Vital signs: BP (!) 184/113    Pulse 80    Temp 98.7 F (37.1 C) (Oral)    Ht 5\' 5"  (1.651 m)    Wt 165 lb 6.4 oz (75 kg)    SpO2 97%    BMI 27.52 kg/m    Physical Exam: Constitutional: No acute distress Breast: Right breast status post I&D on the inferior lateral portion of the areola.  The wound edges appear healthy without any erythema and the wound bed itself is healthy without any purulence or fibrinous material.  The surrounding skin is also softer with less induration compared to prior exam.  Packing material was removed and repacked with quarter inch gauze.  Assessment/Plan: This is a 45 y.o. female s/p right breast I&D.  - Discussed with patient that despite the discomfort, the wound appears healthy without any infection and is healing appropriately.  Discussed with her packing the wound more lightly with quarter inch gauze instead of half-inch gauze to see if this helps better and speed up the healing process.  We will also give her a prescription for Neurontin to see if this helps her with pain control better.  She can also apply over-the-counter lidocaine ointment which would be lower strength compared to prescription but may still help her. - Follow-up in 2 weeks.   60, MD [facility]

## 2021-12-12 ENCOUNTER — Other Ambulatory Visit: Payer: Self-pay

## 2021-12-21 ENCOUNTER — Telehealth: Payer: Self-pay | Admitting: Surgery

## 2021-12-21 NOTE — Telephone Encounter (Signed)
Patient is calling and is complaining  of having a knot come up in her left breast and is concerned about it, she has been seeing Dr Aleen Campi for incision and drainage abscess right breast  done on 12/8. Please call patient and advise.

## 2021-12-21 NOTE — Telephone Encounter (Signed)
Spoke with the patient and she says that the knot came up about 2 days ago. The area is sore. She reports no drainage or fever.  I spoke with Dr Everlene Farrier and he recommends she place warm compresses to the area to help it to drain if it is an abscess. She is to call back if she starts to develop a fever over 100, otherwise she will follow up Wednesday with Dr Aleen Campi.

## 2021-12-25 ENCOUNTER — Encounter: Payer: Self-pay | Admitting: Surgery

## 2021-12-27 ENCOUNTER — Ambulatory Visit (INDEPENDENT_AMBULATORY_CARE_PROVIDER_SITE_OTHER): Payer: Self-pay | Admitting: Surgery

## 2021-12-27 ENCOUNTER — Other Ambulatory Visit: Payer: Self-pay

## 2021-12-27 ENCOUNTER — Encounter: Payer: Self-pay | Admitting: Surgery

## 2021-12-27 VITALS — BP 158/86 | HR 77 | Temp 98.6°F | Ht 65.0 in | Wt 159.6 lb

## 2021-12-27 DIAGNOSIS — Z09 Encounter for follow-up examination after completed treatment for conditions other than malignant neoplasm: Secondary | ICD-10-CM

## 2021-12-27 DIAGNOSIS — N611 Abscess of the breast and nipple: Secondary | ICD-10-CM

## 2021-12-27 DIAGNOSIS — Z1231 Encounter for screening mammogram for malignant neoplasm of breast: Secondary | ICD-10-CM

## 2021-12-27 MED ORDER — CLINDAMYCIN HCL 150 MG PO CAPS: 150.0000 mg | ORAL_CAPSULE | Freq: Three times a day (TID) | ORAL | 0 refills | Status: AC

## 2021-12-27 NOTE — Progress Notes (Signed)
12/27/2021  HPI: Megan Solis is a 46 y.o. female s/p I&D of right breast abscess on 11/30/21.  She presents today for follow up.  She reports that from the right breast standpoint, she's doing much better.  Denies any significant pain, and the wound is almost healed and does not require any further packing.  However, she did notice a new "knot" in the left breast/nipple area associated with tenderness.  She did not try warm compresses despite of this being a recommendation when she called the office, but she did "pop" it a couple days ago and felt better.  She still reports tenderness but it is much improved.  Vital signs: BP (!) 158/86    Pulse 77    Temp 98.6 F (37 C) (Oral)    Ht 5\' 5"  (1.651 m)    Wt 159 lb 9.6 oz (72.4 kg)    SpO2 98%    BMI 26.56 kg/m    Physical Exam: Constitutional: No acute distress Breast:  Right breast wound is almost healed, without any significant depth, measuring about 5 mm in size.  The nipple is softer without any further induration.  Left breast has a small nodule at the 12 o'clock position of the nipple.  No associated drainage or erythema, but is somewhat tender.  Assessment/Plan: This is a 46 y.o. female s/p I&D of right breast abscess.  --The patient's right breast wound is almost healed.  On the left breast, she may have the beginning of an infection vs a clogged duct.  As a precaution, given her history of abscesses, will give prescription for Clindamycin, as this seems to have worked best for her in the past.   --Discussed with her that as the right breast continues to heal, we can resume again the plan for eventual mastectomy.  However, first we need a mammogram to make sure there are no other issues that would alter our plan for simple mastectomy to prevent further abscess recurrences.   --Will order bilateral screening mammogram for the end of January and she will follow up with me afterwards to discuss results and discuss future surgery.   [place], MD [facility]

## 2021-12-27 NOTE — Patient Instructions (Addendum)
Please call [facility] at t [phone] to schedule a Mammogram at the end of January. Call our office when it is schedule so we can make a follow up appointment.  Please pick up prescription at your pharmacy.    If you have any concerns or questions, please feel free to call our office.   Incision and Drainage, Care After This sheet gives you information about how to care for yourself after your procedure. Your health care provider may also give you more specific instructions. If you have problems or questions, contact your health care provider. What can I expect after the procedure? After the procedure, it is common to have: Pain or discomfort around the incision site. Blood, fluid, or pus (drainage) from the incision. Redness and firm skin around the incision site. Follow these instructions at home: Medicines Take over-the-counter and prescription medicines only as told by your health care provider. If you were prescribed an antibiotic medicine, use or take it as told by your health care provider. Do not stop using the antibiotic even if you start to feel better. Wound care Follow instructions from your health care provider about how to take care of your wound. Make sure you: Wash your hands with soap and water before and after you change your bandage (dressing). If soap and water are not available, use hand sanitizer. Change your dressing and packing as told by your health care provider. If your dressing is dry or stuck when you try to remove it, moisten or wet the dressing with saline or water so that it can be removed without harming your skin or tissues. If your wound is packed, leave it in place until your health care provider tells you to remove it. To remove the packing, moisten or wet the packing with saline or water so that it can be removed without harming your skin or tissues. Leave stitches (sutures), skin glue, or adhesive strips in place. These skin closures  may need to stay in place for 2 weeks or longer. If adhesive strip edges start to loosen and curl up, you may trim the loose edges. Do not remove adhesive strips completely unless your health care provider tells you to do that. Check your wound every day for signs of infection. Check for: More redness, swelling, or pain. More fluid or blood. Warmth. Pus or a bad smell. If you were sent home with a drain tube in place, follow instructions from your health care provider about: How to empty it. How to care for it at home.  General instructions Rest the affected area. Do not take baths, swim, or use a hot tub until your health care provider approves. Ask your health care provider if you may take showers. You may only be allowed to take sponge baths. Return to your normal activities as told by your health care provider. Ask your health care provider what activities are safe for you. Your health care provider may put you on activity or lifting restrictions. The incision will continue to drain. It is normal to have some clear or slightly bloody drainage. The amount of drainage should lessen each day. Do not apply any creams, ointments, or liquids unless you have been told to by your health care provider. Keep all follow-up visits as told by your health care provider. This is important. Contact a health care provider if: Your cyst or abscess returns. You have a fever or chills. You have more redness, swelling, or pain around your incision. You  have more fluid or blood coming from your incision. Your incision feels warm to the touch. You have pus or a bad smell coming from your incision. You have red streaks above or below the incision site. Get help right away if: You have severe pain or bleeding. You cannot eat or drink without vomiting. You have decreased urine output. You become short of breath. You have chest pain. You cough up blood. The affected area becomes numb or starts to  tingle. These symptoms may represent a serious problem that is an emergency. Do not wait to see if the symptoms will go away. Get medical help right away. Call your local emergency services (911 in the [place] Do not drive yourself to the hospital. Summary After this procedure, it is common to have fluid, blood, or pus coming from the surgery site. Follow all home care instructions. You will be told how to take care of your incision, how to check for infection, and how to take medicines. If you were prescribed an antibiotic medicine, take it as told by your health care provider. Do not stop taking the antibiotic even if you start to feel better. Contact a health care provider if you have increased redness, swelling, or pain around your incision. Get help right away if you have chest pain, you vomit, you cough up blood, or you have shortness of breath. Keep all follow-up visits as told by your health care provider. This is important. This information is not intended to replace advice given to you by your health care provider. Make sure you discuss any questions you have with your health care provider. Document Revised: 11/10/2018 Document Reviewed: 11/10/2018 Elsevier Patient Education  2022 [employer].

## 2021-12-27 NOTE — Progress Notes (Signed)
mm

## 2022-01-03 ENCOUNTER — Other Ambulatory Visit: Payer: Self-pay

## 2022-01-03 ENCOUNTER — Ambulatory Visit (INDEPENDENT_AMBULATORY_CARE_PROVIDER_SITE_OTHER): Payer: Self-pay | Admitting: Surgery

## 2022-01-03 ENCOUNTER — Encounter: Payer: Self-pay | Admitting: Surgery

## 2022-01-03 ENCOUNTER — Other Ambulatory Visit
Admission: RE | Admit: 2022-01-03 | Discharge: 2022-01-03 | Disposition: A | Discharge status: Home / Self Care | Payer: Self-pay | Source: Ambulatory Visit | Attending: Surgery | Admitting: Surgery

## 2022-01-03 VITALS — BP 170/120 | HR 103 | Temp 98.6°F | Ht 65.0 in | Wt 157.6 lb

## 2022-01-03 DIAGNOSIS — N611 Abscess of the breast and nipple: Secondary | ICD-10-CM

## 2022-01-03 MED ORDER — OXYCODONE HCL 5 MG PO TABS
5.0000 mg | ORAL_TABLET | ORAL | 0 refills | Status: DC | PRN
Start: 2022-01-03 — End: 2022-01-06

## 2022-01-03 NOTE — H&P (View-Only) (Signed)
01/03/2022  History of Present Illness: Megan Solis is a 46 y.o. female status post I&D of right breast abscess in the operating room on 11/30/2021.  Patient was last seen on 12/27/2021 at which time the right abscess wound was healing well and the patient's pain had fully resolved.  There is concern for possibly developing issue on the left breast and she was started on a course of clindamycin.  Today, the patient reports that about 4 days ago, she started having worsening pain in the right breast and yesterday started noticing drainage at the inferior portion of the nipple.  She is having worsening pain but no fevers, chills, chest pain, shortness of breath.  She denies any issues in the left breast now and that has fully resolved.  Past Medical History: Past Medical History:  Diagnosis Date   Abscess of right breast    Alcohol abuse    Hypertension    MRSA (methicillin resistant Staphylococcus aureus)    Pneumonia    Seizures ([place])    only when coming off alcohol   Tobacco abuse      Past Surgical History: Past Surgical History:  Procedure Laterality Date   adnoids     removed and tubes placed   CESAREAN SECTION  2005   2003   INCISION AND DRAINAGE Right    R breast I&D   INCISION AND DRAINAGE ABSCESS Right 11/30/2021   Procedure: INCISION AND DRAINAGE ABSCESS, right breast abscess;  Surgeon: Olean Ree, MD;  Location: [facility] ORS;  Service: General;  Laterality: Right;  Provider requesting 1.5 hours / 90 minutes for procedure.   TUBAL LIGATION      Home Medications: Prior to Admission medications   Medication Sig Start Date End Date Taking? Authorizing Provider  acetaminophen (TYLENOL) 500 MG tablet Take 2 tablets (1,000 mg total) by mouth every 6 (six) hours as needed for mild pain. Patient not taking: Reported on 01/03/2022 11/30/21  Yes Avilene Marrin, MD  clindamycin (CLEOCIN) 150 MG capsule Take 1 capsule (150 mg total) by mouth 3 (three) times daily for 7 days.  12/27/21 01/03/22 Yes Winford Hehn, Jacqulyn Bath, MD  ibuprofen (ADVIL) 800 MG tablet Take 1 tablet (800 mg total) by mouth every 8 (eight) hours as needed for moderate pain. Patient not taking: Reported on 01/03/2022 11/30/21  Yes Brentyn Seehafer, MD  lidocaine (XYLOCAINE) 5 % ointment Apply 1 application topically as needed. Place small amount over nipple and around the wound and allow 15 minutes to set up before removing the packing. Patient not taking: Reported on 01/03/2022 12/04/21  Yes Nitasha Jewel, MD  omeprazole (PRILOSEC) 20 MG capsule Take 20 mg by mouth daily as needed (heartburn/indigestion.).   Yes [provider]  oxyCODONE (ROXICODONE) 5 MG immediate release tablet Take 1 tablet (5 mg total) by mouth every 4 (four) hours as needed for severe pain or moderate pain. 01/03/22 01/03/23 Yes Caden Fukushima, Jacqulyn Bath, MD    Allergies: No Known Allergies  Review of Systems: Review of Systems  Constitutional:  Negative for chills and fever.  Respiratory:  Negative for shortness of breath.   Cardiovascular:  Negative for chest pain.  Gastrointestinal:  Negative for abdominal pain, nausea and vomiting.  Genitourinary:  Negative for dysuria.  Musculoskeletal:  Negative for myalgias.  Skin:        Right breast tenderness at the nipple and drainage   Physical Exam BP (!) 170/120    Pulse (!) 103    Temp 98.6 F (37 C) (Oral)  Ht 5\' 5"  (1.651 m)    Wt 157 lb 9.6 oz (71.5 kg)    SpO2 97%    BMI 26.23 kg/m  CONSTITUTIONAL: No acute distress but is teary-eyed. HEENT:  Normocephalic, atraumatic, extraocular motion intact. RESPIRATORY:  Normal respiratory effort without pathologic use of accessory muscles. CARDIOVASCULAR: Regular rhythm and rate. BREAST: Left breast at the nipple there is no further erythema or induration and no tenderness on palpation.  On the right breast, the patient's I&D wound site is still healing but now the nipple appears to be more firm and swollen compared to prior exam.  This is the  area of most tenderness to the patient but the patient reports tenderness along the entire breast.  There is no significant erythema in the surrounding breast and only firmness and induration at the nipple.  The patient tried to express drainage but no fluid was expressible, however the gauze dressing did have mild purulent fluid. NEUROLOGIC:  Motor and sensation is grossly normal.  Cranial nerves are grossly intact. PSYCH:  Alert and oriented to person, place and time. Affect is normal.   Assessment and Plan: This is a 46 y.o. female with recurrent right breast abscess.  - Despite being on clindamycin, the area of the right breast has worsened over the last few days and is now having active drainage that started yesterday.  Discussed with the patient that at this point, still would not be able to do a mastectomy as is the plan long-term due to the active infection as she would otherwise have a very large open wound.  However I think the next step should be to proceed with excision of the nipple areolar complex and the underlying breast tissue in order to truly excise and clean the area of recurrent abscess.  This will still allow [place] for potential mastectomy in the long-term although since the area of recurrent abscess always been in the central region, this neck surgery may potentially be the last one needed.  Discussed with her that given the infection, we will leave this is an open wound that will require daily dressing changes.  Discussed with her the risks of bleeding, infection, injury to surrounding structures, need for further procedures, postoperative pain control, dressing changes, use of breast binder, but this would be an outpatient surgery, and she is willing to proceed. - We will schedule her for surgery tomorrow 01/04/2022.  The patient is aware this would be a late case but otherwise will be able to proceed pending anesthesia availability.   Melvyn Neth, [facility]

## 2022-01-03 NOTE — Patient Instructions (Addendum)
We spoke today about excising the parts of your breast where the infection keeps recurring. This will be done at [facility] by Dr Aleen Campi.  You will need to pack the area after surgery as it will have to be left open in order to heal.   We are planning on doing this surgery tomorrow. No food for 8-9 hours prior to surgery.   Our surgery scheduler will call you and go over surgery information as well as your arrival time. She will call you today about this.

## 2022-01-03 NOTE — Progress Notes (Signed)
01/03/2022  History of Present Illness: Megan Solis is a 46 y.o. female status post I&D of right breast abscess in the operating room on 11/30/2021.  Patient was last seen on 12/27/2021 at which time the right abscess wound was healing well and the patient's pain had fully resolved.  There is concern for possibly developing issue on the left breast and she was started on a course of clindamycin.  Today, the patient reports that about 4 days ago, she started having worsening pain in the right breast and yesterday started noticing drainage at the inferior portion of the nipple.  She is having worsening pain but no fevers, chills, chest pain, shortness of breath.  She denies any issues in the left breast now and that has fully resolved.  Past Medical History: Past Medical History:  Diagnosis Date   Abscess of right breast    Alcohol abuse    Hypertension    MRSA (methicillin resistant Staphylococcus aureus)    Pneumonia    Seizures ([place])    only when coming off alcohol   Tobacco abuse      Past Surgical History: Past Surgical History:  Procedure Laterality Date   adnoids     removed and tubes placed   CESAREAN SECTION  2005   2003   INCISION AND DRAINAGE Right    R breast I&D   INCISION AND DRAINAGE ABSCESS Right 11/30/2021   Procedure: INCISION AND DRAINAGE ABSCESS, right breast abscess;  Surgeon: Olean Ree, MD;  Location: [facility] ORS;  Service: General;  Laterality: Right;  Provider requesting 1.5 hours / 90 minutes for procedure.   TUBAL LIGATION      Home Medications: Prior to Admission medications   Medication Sig Start Date End Date Taking? Authorizing Provider  acetaminophen (TYLENOL) 500 MG tablet Take 2 tablets (1,000 mg total) by mouth every 6 (six) hours as needed for mild pain. Patient not taking: Reported on 01/03/2022 11/30/21  Yes Greogory Cornette, MD  clindamycin (CLEOCIN) 150 MG capsule Take 1 capsule (150 mg total) by mouth 3 (three) times daily for 7 days.  12/27/21 01/03/22 Yes Zaliah Wissner, Jacqulyn Bath, MD  ibuprofen (ADVIL) 800 MG tablet Take 1 tablet (800 mg total) by mouth every 8 (eight) hours as needed for moderate pain. Patient not taking: Reported on 01/03/2022 11/30/21  Yes Erendida Wrenn, MD  lidocaine (XYLOCAINE) 5 % ointment Apply 1 application topically as needed. Place small amount over nipple and around the wound and allow 15 minutes to set up before removing the packing. Patient not taking: Reported on 01/03/2022 12/04/21  Yes Khyren Hing, MD  omeprazole (PRILOSEC) 20 MG capsule Take 20 mg by mouth daily as needed (heartburn/indigestion.).   Yes [provider]  oxyCODONE (ROXICODONE) 5 MG immediate release tablet Take 1 tablet (5 mg total) by mouth every 4 (four) hours as needed for severe pain or moderate pain. 01/03/22 01/03/23 Yes Fara Worthy, Jacqulyn Bath, MD    Allergies: No Known Allergies  Review of Systems: Review of Systems  Constitutional:  Negative for chills and fever.  Respiratory:  Negative for shortness of breath.   Cardiovascular:  Negative for chest pain.  Gastrointestinal:  Negative for abdominal pain, nausea and vomiting.  Genitourinary:  Negative for dysuria.  Musculoskeletal:  Negative for myalgias.  Skin:        Right breast tenderness at the nipple and drainage   Physical Exam BP (!) 170/120    Pulse (!) 103    Temp 98.6 F (37 C) (Oral)  Ht 5\' 5"  (1.651 m)    Wt 157 lb 9.6 oz (71.5 kg)    SpO2 97%    BMI 26.23 kg/m  CONSTITUTIONAL: No acute distress but is teary-eyed. HEENT:  Normocephalic, atraumatic, extraocular motion intact. RESPIRATORY:  Normal respiratory effort without pathologic use of accessory muscles. CARDIOVASCULAR: Regular rhythm and rate. BREAST: Left breast at the nipple there is no further erythema or induration and no tenderness on palpation.  On the right breast, the patient's I&D wound site is still healing but now the nipple appears to be more firm and swollen compared to prior exam.  This is the  area of most tenderness to the patient but the patient reports tenderness along the entire breast.  There is no significant erythema in the surrounding breast and only firmness and induration at the nipple.  The patient tried to express drainage but no fluid was expressible, however the gauze dressing did have mild purulent fluid. NEUROLOGIC:  Motor and sensation is grossly normal.  Cranial nerves are grossly intact. PSYCH:  Alert and oriented to person, place and time. Affect is normal.   Assessment and Plan: This is a 46 y.o. female with recurrent right breast abscess.  - Despite being on clindamycin, the area of the right breast has worsened over the last few days and is now having active drainage that started yesterday.  Discussed with the patient that at this point, still would not be able to do a mastectomy as is the plan long-term due to the active infection as she would otherwise have a very large open wound.  However I think the next step should be to proceed with excision of the nipple areolar complex and the underlying breast tissue in order to truly excise and clean the area of recurrent abscess.  This will still allow [place] for potential mastectomy in the long-term although since the area of recurrent abscess always been in the central region, this neck surgery may potentially be the last one needed.  Discussed with her that given the infection, we will leave this is an open wound that will require daily dressing changes.  Discussed with her the risks of bleeding, infection, injury to surrounding structures, need for further procedures, postoperative pain control, dressing changes, use of breast binder, but this would be an outpatient surgery, and she is willing to proceed. - We will schedule her for surgery tomorrow 01/04/2022.  The patient is aware this would be a late case but otherwise will be able to proceed pending anesthesia availability.   Melvyn Neth, [facility]

## 2022-01-03 NOTE — Patient Instructions (Addendum)
Your procedure is scheduled on: Thursday January 04, 2022. Report to Day Surgery inside [facility] 2nd floor. To find out your arrival time please call [phone] between 1PM - 3PM on Wednesday January 03, 2022.  Remember: Instructions that are not followed completely may result in serious medical risk,  up to and including death, or upon the discretion of your surgeon and anesthesiologist your  surgery may need to be rescheduled.     _X__ 1. Do not eat food after midnight the night before your procedure.                 No chewing gum or hard candies. You may drink clear liquids up to 2 hours                 before you are scheduled to arrive for your surgery- DO not drink clear                 liquids within 2 hours of the start of your surgery.                 Clear Liquids include:  water, apple juice without pulp, clear Gatorade, G2 or                  Gatorade Zero (avoid Red/Purple/Blue), Black Coffee or Tea (Do not add                 anything to coffee or tea).  __X__2.  On the morning of surgery brush your teeth with toothpaste and water, you                may rinse your mouth with mouthwash if you wish.  Do not swallow any toothpaste or mouthwash.     _X__ 3.  No Alcohol for 24 hours before or after surgery.   _X__ 4.  Do Not Smoke or use e-cigarettes For 24 Hours Prior to Your Surgery.                 Do not use any chewable tobacco products for at least 6 hours prior to                 Surgery.  _X__  5.  Do not use any recreational drugs (marijuana, cocaine, heroin, ecstasy, MDMA or other)                For at least one week prior to your surgery.  Combination of these drugs with anesthesia                May have life threatening results.  ____  6.  Bring all medications with you on the day of surgery if instructed.   __X__  7.  Notify your doctor if there is any change in your medical condition      (cold, fever, infections).     Do not  wear jewelry, make-up, hairpins, clips or nail polish. Do not wear lotions, powders, or perfumes. You may wear deodorant. Do not shave 48 hours prior to surgery. Men may shave face and neck. Do not bring valuables to the hospital.    [facility] is not responsible for any belongings or valuables.  Contacts, dentures or bridgework may not be worn into surgery. Leave your suitcase in the car. After surgery it may be brought to your room. For patients admitted to the hospital, discharge time is determined by your treatment team.   Patients discharged  the day of surgery will not be allowed to drive home.   Make arrangements for someone to be with you for the first 24 hours of your Same Day Discharge.   __X__ Take these medicines the morning of surgery with A SIP OF WATER:    1. omeprazole (PRILOSEC) 20 MG  2. clindamycin (CLEOCIN) 150 MG capsule  3.   4.  5.  6.  ____ Fleet Enema (as directed)   __X__ Use Antibacterial Soap (or wipes) as directed  ____ Use Benzoyl Peroxide Gel as instructed  ____ Use inhalers on the day of surgery  ____ Stop metformin 2 days prior to surgery    ____ Take 1/2 of usual insulin dose the night before surgery. No insulin the morning          of surgery.   ____ Call your PCP, cardiologist, or Pulmonologist if taking Coumadin/Plavix/aspirin and ask when to stop before your surgery.   __X__ One Week prior to surgery- Stop Anti-inflammatories such as Ibuprofen, Aleve, Advil, Motrin, meloxicam (MOBIC), diclofenac, etodolac, ketorolac, Toradol, Daypro, piroxicam, Goody's or BC powders. OK TO USE TYLENOL IF NEEDED   __X__ Stop supplements until after surgery.    ____ Bring C-Pap to the hospital.    If you have any questions regarding your pre-procedure instructions,  Please call Pre-admit Testing at [phone]

## 2022-01-04 ENCOUNTER — Encounter: Payer: Self-pay | Admitting: Surgery

## 2022-01-04 ENCOUNTER — Ambulatory Visit: Payer: Self-pay | Admitting: Certified Registered"

## 2022-01-04 ENCOUNTER — Other Ambulatory Visit: Payer: Self-pay

## 2022-01-04 ENCOUNTER — Ambulatory Visit
Admission: RE | Admit: 2022-01-04 | Discharge: 2022-01-04 | Disposition: A | Discharge status: Home / Self Care | Payer: Self-pay | Attending: Surgery | Admitting: Surgery

## 2022-01-04 ENCOUNTER — Encounter
Admission: RE | Disposition: A | Discharge status: Home / Self Care | Payer: Self-pay | Source: Home / Self Care | Attending: Surgery

## 2022-01-04 DIAGNOSIS — N611 Abscess of the breast and nipple: Secondary | ICD-10-CM | POA: Insufficient documentation

## 2022-01-04 HISTORY — PX: BREAST CYST EXCISION: SHX579

## 2022-01-04 LAB — POCT PREGNANCY, URINE: Preg Test, Ur: NEGATIVE

## 2022-01-04 SURGERY — EXCISION, CYST, BREAST
Anesthesia: General | Laterality: Right

## 2022-01-04 MED ORDER — KETOROLAC TROMETHAMINE 30 MG/ML IJ SOLN
INTRAMUSCULAR | Status: AC
  Filled 2022-01-04: qty 1

## 2022-01-04 MED ORDER — PROMETHAZINE HCL 25 MG/ML IJ SOLN: 6.2500 mg | INTRAMUSCULAR | Status: DC | PRN

## 2022-01-04 MED ORDER — GABAPENTIN 300 MG PO CAPS
ORAL_CAPSULE | ORAL | Status: AC
  Filled 2022-01-04: qty 1

## 2022-01-04 MED ORDER — FENTANYL CITRATE (PF) 100 MCG/2ML IJ SOLN
INTRAMUSCULAR | Status: AC
  Filled 2022-01-04: qty 2

## 2022-01-04 MED ORDER — PREGABALIN 50 MG PO CAPS
50.0000 mg | ORAL_CAPSULE | ORAL | Status: AC
  Administered 2022-01-04: 50 mg via ORAL
  Filled 2022-01-04: qty 1

## 2022-01-04 MED ORDER — BUPIVACAINE LIPOSOME 1.3 % IJ SUSP
INTRAMUSCULAR | Status: DC | PRN
  Administered 2022-01-04: 40 mL

## 2022-01-04 MED ORDER — ONDANSETRON HCL 4 MG/2ML IJ SOLN
INTRAMUSCULAR | Status: DC | PRN
Start: 2022-01-04 — End: 2022-01-04
  Administered 2022-01-04: 4 mg via INTRAVENOUS

## 2022-01-04 MED ORDER — GABAPENTIN 300 MG PO CAPS: 300.0000 mg | ORAL_CAPSULE | ORAL | Status: DC

## 2022-01-04 MED ORDER — FENTANYL CITRATE (PF) 100 MCG/2ML IJ SOLN
INTRAMUSCULAR | Status: AC
  Administered 2022-01-04: 25 ug via INTRAVENOUS
  Filled 2022-01-04: qty 2

## 2022-01-04 MED ORDER — EPHEDRINE SULFATE-NACL 50-0.9 MG/10ML-% IV SOSY
PREFILLED_SYRINGE | INTRAVENOUS | Status: DC | PRN
  Administered 2022-01-04: 10 mg via INTRAVENOUS

## 2022-01-04 MED ORDER — DEXAMETHASONE SODIUM PHOSPHATE 10 MG/ML IJ SOLN
INTRAMUSCULAR | Status: AC
  Filled 2022-01-04: qty 1

## 2022-01-04 MED ORDER — BUPIVACAINE LIPOSOME 1.3 % IJ SUSP
INTRAMUSCULAR | Status: AC
  Filled 2022-01-04: qty 10

## 2022-01-04 MED ORDER — ACETAMINOPHEN 10 MG/ML IV SOLN: 1000.0000 mg | Freq: Once | INTRAVENOUS | Status: DC | PRN

## 2022-01-04 MED ORDER — OXYCODONE HCL 5 MG PO TABS
ORAL_TABLET | ORAL | Status: AC
  Filled 2022-01-04: qty 1

## 2022-01-04 MED ORDER — CEFAZOLIN SODIUM-DEXTROSE 2-4 GM/100ML-% IV SOLN
INTRAVENOUS | Status: AC
  Filled 2022-01-04: qty 100

## 2022-01-04 MED ORDER — ONDANSETRON HCL 4 MG/2ML IJ SOLN
INTRAMUSCULAR | Status: AC
  Filled 2022-01-04: qty 2

## 2022-01-04 MED ORDER — FENTANYL CITRATE (PF) 100 MCG/2ML IJ SOLN
25.0000 ug | INTRAMUSCULAR | Status: DC | PRN
  Administered 2022-01-04: 50 ug via INTRAVENOUS

## 2022-01-04 MED ORDER — DAKINS (1/4 STRENGTH) 0.125 % EX SOLN
CUTANEOUS | Status: AC
  Filled 2022-01-04: qty 473

## 2022-01-04 MED ORDER — ACETAMINOPHEN 500 MG PO TABS
ORAL_TABLET | ORAL | Status: AC
  Administered 2022-01-04: 1000 mg via ORAL
  Filled 2022-01-04: qty 2

## 2022-01-04 MED ORDER — ORAL CARE MOUTH RINSE: 15.0000 mL | Freq: Once | OROMUCOSAL | Status: AC

## 2022-01-04 MED ORDER — 0.9 % SODIUM CHLORIDE (POUR BTL) OPTIME
TOPICAL | Status: DC | PRN
Start: 2022-01-04 — End: 2022-01-04
  Administered 2022-01-04: 250 mL

## 2022-01-04 MED ORDER — PREGABALIN 50 MG PO CAPS
ORAL_CAPSULE | ORAL | Status: AC
  Filled 2022-01-04: qty 1

## 2022-01-04 MED ORDER — KETOROLAC TROMETHAMINE 30 MG/ML IJ SOLN
INTRAMUSCULAR | Status: DC | PRN
Start: 2022-01-04 — End: 2022-01-04
  Administered 2022-01-04: 30 mg via INTRAVENOUS

## 2022-01-04 MED ORDER — LIDOCAINE HCL (CARDIAC) PF 100 MG/5ML IV SOSY
PREFILLED_SYRINGE | INTRAVENOUS | Status: DC | PRN
  Administered 2022-01-04: 100 mg via INTRAVENOUS

## 2022-01-04 MED ORDER — CHLORHEXIDINE GLUCONATE CLOTH 2 % EX PADS: 6.0000 | MEDICATED_PAD | Freq: Once | CUTANEOUS | Status: DC

## 2022-01-04 MED ORDER — GLYCOPYRROLATE 0.2 MG/ML IJ SOLN
INTRAMUSCULAR | Status: DC | PRN
Start: 2022-01-04 — End: 2022-01-04
  Administered 2022-01-04: .2 mg via INTRAVENOUS

## 2022-01-04 MED ORDER — BUPIVACAINE LIPOSOME 1.3 % IJ SUSP: 20.0000 mL | Freq: Once | INTRAMUSCULAR | Status: DC

## 2022-01-04 MED ORDER — LACTATED RINGERS IV SOLN: INTRAVENOUS | Status: DC

## 2022-01-04 MED ORDER — OXYCODONE HCL 5 MG/5ML PO SOLN: 5.0000 mg | Freq: Once | ORAL | Status: AC | PRN

## 2022-01-04 MED ORDER — CHLORHEXIDINE GLUCONATE 0.12 % MT SOLN
15.0000 mL | Freq: Once | OROMUCOSAL | Status: AC
  Administered 2022-01-04: 15 mL via OROMUCOSAL

## 2022-01-04 MED ORDER — CHLORHEXIDINE GLUCONATE 0.12 % MT SOLN
OROMUCOSAL | Status: AC
  Filled 2022-01-04: qty 15

## 2022-01-04 MED ORDER — FENTANYL CITRATE (PF) 100 MCG/2ML IJ SOLN
INTRAMUSCULAR | Status: DC | PRN
  Administered 2022-01-04 (×2): 50 ug via INTRAVENOUS

## 2022-01-04 MED ORDER — ACETAMINOPHEN 500 MG PO TABS: 1000.0000 mg | ORAL_TABLET | ORAL | Status: AC

## 2022-01-04 MED ORDER — GLYCOPYRROLATE 0.2 MG/ML IJ SOLN
INTRAMUSCULAR | Status: AC
  Filled 2022-01-04: qty 1

## 2022-01-04 MED ORDER — PREGABALIN 50 MG PO CAPS: 50.0000 mg | ORAL_CAPSULE | Freq: Three times a day (TID) | ORAL | 0 refills | Status: DC

## 2022-01-04 MED ORDER — BUPIVACAINE-EPINEPHRINE (PF) 0.5% -1:200000 IJ SOLN
INTRAMUSCULAR | Status: AC
  Filled 2022-01-04: qty 30

## 2022-01-04 MED ORDER — OXYCODONE HCL 5 MG PO TABS
5.0000 mg | ORAL_TABLET | Freq: Once | ORAL | Status: AC | PRN
  Administered 2022-01-04: 5 mg via ORAL

## 2022-01-04 MED ORDER — PROPOFOL 10 MG/ML IV BOLUS
INTRAVENOUS | Status: DC | PRN
Start: 2022-01-04 — End: 2022-01-04
  Administered 2022-01-04: 200 mg via INTRAVENOUS

## 2022-01-04 MED ORDER — DEXAMETHASONE SODIUM PHOSPHATE 10 MG/ML IJ SOLN
INTRAMUSCULAR | Status: DC | PRN
  Administered 2022-01-04: 10 mg via INTRAVENOUS

## 2022-01-04 MED ORDER — CEFAZOLIN SODIUM-DEXTROSE 2-4 GM/100ML-% IV SOLN
2.0000 g | INTRAVENOUS | Status: AC
  Administered 2022-01-04: 2 g via INTRAVENOUS

## 2022-01-04 SURGICAL SUPPLY — 36 items
BINDER BREAST LRG (GAUZE/BANDAGES/DRESSINGS) ×1 IMPLANT
BNDG GAUZE ELAST 4 BULKY (GAUZE/BANDAGES/DRESSINGS) ×1 IMPLANT
CHLORAPREP W/TINT 26 (MISCELLANEOUS) ×2 IMPLANT
CNTNR SPEC 2.5X3XGRAD LEK (MISCELLANEOUS)
CONT SPEC 4OZ STER OR WHT (MISCELLANEOUS)
CONTAINER SPEC 2.5X3XGRAD LEK (MISCELLANEOUS) ×1 IMPLANT
DRAIN PENROSE 12X.25 LTX STRL (MISCELLANEOUS) ×2 IMPLANT
DRAPE LAPAROTOMY 77X122 PED (DRAPES) ×2 IMPLANT
DRSG GAUZE FLUFF 36X18 (GAUZE/BANDAGES/DRESSINGS) ×1 IMPLANT
ELECT CAUTERY BLADE TIP 2.5 (TIP) ×2
ELECT REM PT RETURN 9FT ADLT (ELECTROSURGICAL) ×2
ELECTRODE CAUTERY BLDE TIP 2.5 (TIP) ×1 IMPLANT
ELECTRODE REM PT RTRN 9FT ADLT (ELECTROSURGICAL) ×1 IMPLANT
GAUZE 4X4 16PLY ~~LOC~~+RFID DBL (SPONGE) ×2 IMPLANT
GAUZE SPONGE 4X4 12PLY STRL (GAUZE/BANDAGES/DRESSINGS) ×2 IMPLANT
GLOVE SURG SYN 7.0 (GLOVE) ×2 IMPLANT
GLOVE SURG SYN 7.0 PF PI (GLOVE) ×1 IMPLANT
GLOVE SURG SYN 7.5  E (GLOVE) ×1
GLOVE SURG SYN 7.5 E (GLOVE) ×1 IMPLANT
GLOVE SURG SYN 7.5 PF PI (GLOVE) ×1 IMPLANT
GOWN STRL REUS W/ TWL LRG LVL3 (GOWN DISPOSABLE) ×2 IMPLANT
GOWN STRL REUS W/TWL LRG LVL3 (GOWN DISPOSABLE) ×2
KIT TURNOVER KIT A (KITS) ×2 IMPLANT
LABEL OR SOLS (LABEL) ×2 IMPLANT
MANIFOLD NEPTUNE II (INSTRUMENTS) ×2 IMPLANT
NEEDLE HYPO 22GX1.5 SAFETY (NEEDLE) ×2 IMPLANT
NS IRRIG 500ML POUR BTL (IV SOLUTION) ×2 IMPLANT
PACK BASIN MINOR ARMC (MISCELLANEOUS) ×2 IMPLANT
PAD ABD DERMACEA PRESS 5X9 (GAUZE/BANDAGES/DRESSINGS) ×2 IMPLANT
SOL PREP PVP 2OZ (MISCELLANEOUS)
SOLUTION PREP PVP 2OZ (MISCELLANEOUS) ×1 IMPLANT
SPONGE T-LAP 18X18 ~~LOC~~+RFID (SPONGE) ×4 IMPLANT
SWAB CULTURE AMIES ANAERIB BLU (MISCELLANEOUS) ×2 IMPLANT
SYR 20ML LL LF (SYRINGE) ×2 IMPLANT
SYR BULB IRRIG 60ML STRL (SYRINGE) ×1 IMPLANT
WATER STERILE IRR 500ML POUR (IV SOLUTION) ×1 IMPLANT

## 2022-01-04 NOTE — Transfer of Care (Signed)
Immediate Anesthesia Transfer of Care Note  Patient: Megan Solis  Procedure(s) Performed: CYST EXCISION BREAST, abscess (Right)  Patient Location: PACU  Anesthesia Type:General  Level of Consciousness: awake  Airway & Oxygen Therapy: Patient Spontanous Breathing  Post-op Assessment: Report given to RN  Post vital signs: Reviewed and stable  Last Vitals:  Vitals Value Taken Time  BP 108/76 01/04/22 1803  Temp 37.1 C 01/04/22 1803  Pulse 104 01/04/22 1811  Resp 19 01/04/22 1811  SpO2 96 % 01/04/22 1811  Vitals shown include unvalidated device data.  Last Pain:  Vitals:   01/04/22 1803  TempSrc:   PainSc: 10-Worst pain ever         Complications: No notable events documented.

## 2022-01-04 NOTE — Discharge Instructions (Signed)
AMBULATORY SURGERY  ?DISCHARGE INSTRUCTIONS ? ? ?The drugs that you were given will stay in your system until tomorrow so for the next 24 hours you should not: ? ?Drive an automobile ?Make any legal decisions ?Drink any alcoholic beverage ? ? ?You may resume regular meals tomorrow.  Today it is better to start with liquids and gradually work up to solid foods. ? ?You may eat anything you prefer, but it is better to start with liquids, then soup and crackers, and gradually work up to solid foods. ? ? ?Please notify your doctor immediately if you have any unusual bleeding, trouble breathing, redness and pain at the surgery site, drainage, fever, or pain not relieved by medication. ? ? ? ?Additional Instructions: ? ? ? ?Please contact your physician with any problems or Same Day Surgery at [phone], Monday through Friday 6 am to 4 pm, or [place] at [place] Main number at [phone].  ?

## 2022-01-04 NOTE — Op Note (Signed)
°  Procedure Date:  01/04/2022  Pre-operative Diagnosis:  Recurrent right breast abscess  Post-operative Diagnosis: Recurrent right breast abscess  Procedure:  Excision of right breast abscess including nipple/areola.  Surgeon:  Howie Ill, MD  Assistant:  Karl Bales, PA-S  Anesthesia:  General endotracheal  Estimated Blood Loss:  10 ml  Specimens:  Right nipple  Complications:  None  Indications for Procedure:  This is a 46 y.o. female with diagnosis of a recurrent right breast abscess at the inferolateral portion of the nipple/areola.  She has had prior I&D but presents with another recurrence.  Discussed with her the need for excision of the nipple/areola to get rid of the abscess.  The risks of bleeding, abscess or infection, injury to surrounding structures, and need for further procedures were all discussed with the patient and was willing to proceed.  Description of Procedure: The patient was correctly identified in the preoperative area and brought into the operating room.  The patient was placed supine with VTE prophylaxis in place.  Appropriate time-outs were performed.  Anesthesia was induced and the patient was intubated.  Appropriate antibiotics were infused.  The patient's right breast was prepped and draped in usual sterile fashion.  Using cautery, the nipple/areola complex was incised circumferentially and then excised, including the retroareolar space.  This was marked for pathology with silk sutures -- short superior, long lateral.  The cavity edges were palpated and found to be soft without any further fibrotic changes or inflammation.  The cavity was then irrigated and cleaned.  40 ml of Exparel solution mixed with 0.5% bupivacaine was infiltrated throughout the cavity and skin edges.  The base of the wound was then closed to help decrease the depth of the cavity, using 2-0 Silk sutures.  The final wound size was approximately 5 x 3 x 3 cm.  The wound was packed  with wet to dry 4x4 gauze and covered with ABD pad and tape.  A breast binder was placed to help with comfort.  The patient was then emerged from anesthesia, extubated, and brought to the recovery room for further management.  The patient tolerated the procedure well and all counts were correct at the end of the case.   Howie Ill, MD

## 2022-01-04 NOTE — Anesthesia Preprocedure Evaluation (Addendum)
Anesthesia Evaluation  Patient identified by MRN, date of birth, ID band Patient awake    Reviewed: Allergy & Precautions, H&P , NPO status , Patient's Chart, lab work & pertinent test results  Airway Mallampati: II  TM Distance: >3 FB Neck ROM: Full    Dental  (+) Missing, Poor Dentition, Edentulous Upper,    Pulmonary Current SmokerPatient did not abstain from smoking.,    Pulmonary exam normal        Cardiovascular Exercise Tolerance: Good hypertension, Normal cardiovascular exam     Neuro/Psych Seizures - (remotely after withdrawal from alcohol), Well Controlled,  negative psych ROS   GI/Hepatic Neg liver ROS, GERD  Controlled,  Endo/Other  negative endocrine ROS  Renal/GU negative Renal ROS  negative genitourinary   Musculoskeletal negative musculoskeletal ROS (+)   Abdominal Normal abdominal exam  (+)   Peds negative pediatric ROS (+)  Hematology negative hematology ROS (+)   Anesthesia Other Findings Right breast abscess  Reproductive/Obstetrics negative OB ROS                            Anesthesia Physical  Anesthesia Plan  ASA: 2  Anesthesia Plan: General   Post-op Pain Management:    Induction: Intravenous  PONV Risk Score and Plan: 2 and Ondansetron, Dexamethasone and Midazolam  Airway Management Planned: LMA  Additional Equipment:   Intra-op Plan:   Post-operative Plan: Extubation in OR  Informed Consent: I have reviewed the patients History and Physical, chart, labs and discussed the procedure including the risks, benefits and alternatives for the proposed anesthesia with the patient or authorized representative who has indicated his/her understanding and acceptance.     Dental advisory given  Plan Discussed with: CRNA and Anesthesiologist  Anesthesia Plan Comments:         Anesthesia Quick Evaluation

## 2022-01-04 NOTE — Progress Notes (Signed)
Discharge reviewed by [facility] RN with boyfriend david and also explained dressing change to patient.  Patient has changed her dressing in past including packing.  Supplies given to patient to take home for dressing changes.

## 2022-01-04 NOTE — Interval H&P Note (Signed)
History and Physical Interval Note:  01/04/2022 4:24 PM  Megan Solis  has presented today for surgery, with the diagnosis of right breast abscess.  The various methods of treatment have been discussed with the patient and family. After consideration of risks, benefits and other options for treatment, the patient has consented to  Procedure(s): CYST EXCISION BREAST, abscess (Right) as a surgical intervention.  The patient's history has been reviewed, patient examined, no change in status, stable for surgery.  I have reviewed the patient's chart and labs.  Questions were answered to the patient's satisfaction.     Texanna Hilburn

## 2022-01-04 NOTE — Anesthesia Postprocedure Evaluation (Signed)
Anesthesia Post Note  Patient: Megan Solis  Procedure(s) Performed: CYST EXCISION BREAST, abscess (Right)  Patient location during evaluation: PACU Anesthesia Type: General Level of consciousness: awake and alert Pain management: pain level controlled Vital Signs Assessment: post-procedure vital signs reviewed and stable Respiratory status: spontaneous breathing, nonlabored ventilation and respiratory function stable Cardiovascular status: blood pressure returned to baseline and stable Postop Assessment: no apparent nausea or vomiting Anesthetic complications: no   No notable events documented.   Last Vitals:  Vitals:   01/04/22 1845 01/04/22 1900  BP: 100/72 103/61  Pulse: 92 90  Resp: 18 15  Temp:  37.1 C  SpO2: 92% 93%    Last Pain:  Vitals:   01/04/22 1858  TempSrc:   PainSc: 5                  Foye Deer

## 2022-01-05 ENCOUNTER — Encounter: Payer: Self-pay | Admitting: Surgery

## 2022-01-06 ENCOUNTER — Other Ambulatory Visit: Payer: Self-pay | Admitting: Surgery

## 2022-01-06 MED ORDER — OXYCODONE HCL 5 MG PO TABS: 5.0000 mg | ORAL_TABLET | ORAL | 0 refills | Status: DC | PRN

## 2022-01-08 ENCOUNTER — Telehealth: Payer: Self-pay | Admitting: *Deleted

## 2022-01-08 LAB — SURGICAL PATHOLOGY

## 2022-01-08 NOTE — Telephone Encounter (Signed)
Return to work not written. She will pick this up today.

## 2022-01-08 NOTE — Telephone Encounter (Signed)
Patient called and wanted to come pick up a return to work note for returning tomorrow light duty.   She had surgery on 01/04/22 Dr Aleen Campi cyst excision breast abscess

## 2022-01-09 ENCOUNTER — Other Ambulatory Visit: Payer: Self-pay | Admitting: Surgery

## 2022-01-09 ENCOUNTER — Telehealth: Payer: Self-pay | Admitting: *Deleted

## 2022-01-09 ENCOUNTER — Telehealth: Payer: Self-pay

## 2022-01-09 MED ORDER — OXYCODONE HCL 5 MG PO TABS: 5.0000 mg | ORAL_TABLET | ORAL | 0 refills | Status: DC | PRN

## 2022-01-09 NOTE — Telephone Encounter (Signed)
Patient calls back, she is informed of the message below per Dr. Aleen Campi that this will be the last prescription for Oxycodone.  She is encouraged to taper this prescription and can use Ibuprofen and Tylenol.  Patient verbalized understanding.

## 2022-01-09 NOTE — Telephone Encounter (Signed)
Patient called and stated that she wants to get another refill on oxycodone. She uses the [facility] in [place].   She had surgery on 01/04/22 Dr Aleen Campi cyst excision breast abscess

## 2022-01-09 NOTE — Telephone Encounter (Signed)
Left detailed message letting patient know RX was sent and that no more refills to take ibuprofen and tylenol.

## 2022-01-12 ENCOUNTER — Ambulatory Visit (INDEPENDENT_AMBULATORY_CARE_PROVIDER_SITE_OTHER): Payer: Self-pay | Admitting: Surgery

## 2022-01-12 ENCOUNTER — Other Ambulatory Visit: Payer: Self-pay

## 2022-01-12 ENCOUNTER — Encounter: Payer: Self-pay | Admitting: Surgery

## 2022-01-12 VITALS — BP 127/72 | HR 83 | Temp 98.3°F | Ht 65.0 in | Wt 167.0 lb

## 2022-01-12 DIAGNOSIS — N611 Abscess of the breast and nipple: Secondary | ICD-10-CM

## 2022-01-12 DIAGNOSIS — Z09 Encounter for follow-up examination after completed treatment for conditions other than malignant neoplasm: Secondary | ICD-10-CM

## 2022-01-12 MED ORDER — GABAPENTIN 100 MG PO CAPS: 100.0000 mg | ORAL_CAPSULE | Freq: Three times a day (TID) | ORAL | 0 refills | Status: DC

## 2022-01-12 NOTE — Progress Notes (Signed)
01/12/2022  HPI: Megan Solis is a 46 y.o. female s/p excision of right breast abscess on 01/04/22.  As part of this excision, the nipple/areola complex was excised as this was the site of recurrent abscess.  Patient presents for follow-up.  She has had significant pain after surgery and has been given three prescriptions of 30 tabs of oxycodone since her surgery.  She reports that she's needing 2 tablets at a time as one tablet is not enough sometimes.  She has been doing wet to dry dressing changes as instructed but was worried about some worsening pain at the medial portion of the wound and is worried about the smell.  Vital signs: BP 127/72    Pulse 83    Temp 98.3 F (36.8 C)    Ht 5\' 5"  (1.651 m)    Wt 167 lb (75.8 kg)    SpO2 92%    BMI 27.79 kg/m    Physical Exam: Constitutional: No acute distress Breast:  Right breast s/p excision of abscess involving the nipple/areola complex.  Wound bed appears healthy, with healthy granulation tissue, minimal fibrinous changes, without any purulence, and without any foul smell.  Q tip probing in the medial aspect does not reveal any concerning findings.  No erythema, induration, and the breast tissue is overall soft.  New wet to dry gauze dressing applied.  Assessment/Plan: This is a 46 y.o. female s/p excision of right breast abscess.  --Discussed with the patient that we can prescribe her neurontin again.  She reported having nightmares after the previous prescription, but will try to change to a lower dose that she can titrate as needed.  Discussed with her the rationale behind no further prescriptions for oxycodone, as we're limited to the amount of narcotic that can be prescribed at a time.  I think it'd be best to try a different non-narcotic medication.  She's in agreement. --Continue wet to dry dressing changes as currently doing. --Follow up in two weeks for wound check.   60, MD [facility]

## 2022-01-12 NOTE — Patient Instructions (Addendum)
Please pick up your prescription at your pharmacy.   If you have any concerns or questions, please feel free to call our office. See follow up appointment below.   Excision of Skin Lesions, Care After The following information offers guidance on how to care for yourself after your procedure. Your health care provider may also give you more specific instructions. If you have problems or questions, contact your health care provider. What can I expect after the procedure? After your procedure, it is common to have: Soreness or mild pain. Some redness and swelling. Follow these instructions at home: Excision site care  Follow instructions from your health care provider about how to take care of your excision site. Make sure you: Wash your hands with soap and water for at least 20 seconds before and after you change your bandage (dressing). If soap and water are not available, use hand sanitizer. Change your dressing as told by your health care provider. Leave stitches (sutures), skin glue, or adhesive strips in place. These skin closures may need to stay in place for 2 weeks or longer. If adhesive strip edges start to loosen and curl up, you may trim the loose edges. Do not remove adhesive strips completely unless your health care provider tells you to do that. Check the excision area every day for signs of infection. Watch for: More redness, swelling, or pain. Fluid or blood. Warmth. Pus or a bad smell. Keep the site clean, dry, and protected for at least 48 hours. For bleeding, apply gentle but firm pressure to the area using a folded towel for 20 minutes. Do not take baths, swim, or use a hot tub until your health care provider approves. Ask your health care provider if you may take showers. You may only be allowed to take sponge baths. General instructions Take over-the-counter and prescription medicines only as told by your health care provider. Follow instructions from your health care  provider about how to minimize scarring. Scarring should lessen over time. Avoid sun exposure until the area has healed. Use sunscreen to protect the area from the sun after it has healed. Avoid high-impact exercise and activities until the sutures are removed or the area heals. Keep all follow-up visits. This is important. Contact a health care provider if: You have more redness, swelling, or pain around your excision site. You have fluid or blood coming from your excision site. Your excision site feels warm to the touch. You have pus or a bad smell coming from your excision site. You have a fever. You have pain that does not improve in 2-3 days after your procedure. Get help right away if: You have bleeding that does not stop with pressure or a dressing. Your wound opens up. Summary Take over-the-counter and prescription medicines only as told by your health care provider. Change your dressing as told by your health care provider. Contact a health care provider if you have redness, swelling, pain, or other signs of infection around your excision site. Keep all follow-up visits. This is important. This information is not intended to replace advice given to you by your health care provider. Make sure you discuss any questions you have with your health care provider. Document Revised: 07/11/2021 Document Reviewed: 07/11/2021 Elsevier Patient Education  2022 [employer].

## 2022-01-17 ENCOUNTER — Encounter: Payer: Self-pay | Admitting: Physician Assistant

## 2022-01-29 ENCOUNTER — Encounter: Payer: Self-pay | Admitting: Surgery

## 2022-01-29 ENCOUNTER — Other Ambulatory Visit: Payer: Self-pay

## 2022-01-29 ENCOUNTER — Ambulatory Visit (INDEPENDENT_AMBULATORY_CARE_PROVIDER_SITE_OTHER): Payer: Self-pay | Admitting: Surgery

## 2022-01-29 VITALS — BP 145/87 | HR 74 | Temp 98.8°F | Ht 64.0 in | Wt 159.6 lb

## 2022-01-29 DIAGNOSIS — N611 Abscess of the breast and nipple: Secondary | ICD-10-CM

## 2022-01-29 DIAGNOSIS — Z09 Encounter for follow-up examination after completed treatment for conditions other than malignant neoplasm: Secondary | ICD-10-CM

## 2022-01-29 NOTE — Patient Instructions (Signed)
Please call if you have any questions or concerns. Please see your follow up appointment listed below.

## 2022-01-29 NOTE — Progress Notes (Signed)
01/29/2022  HPI: Megan Solis is a 46 y.o. female s/p excision of right breast nipple and abscess on 01/04/2022.  The patient has a history of recurrent right breast abscess.  She has been doing packing dressing changes and reports that last week she started packing the wound as there was not much space for packing anymore.  Denies any further pain and reports that she has been doing very well from the breast standpoint.  Vital signs: BP (!) 145/87    Pulse 74    Temp 98.8 F (37.1 C) (Oral)    Ht 5\' 4"  (1.626 m)    Wt 159 lb 9.6 oz (72.4 kg)    SpO2 98%    BMI 27.40 kg/m    Physical Exam: Constitutional: No acute distress Breast: Right breast status post excision with wound healing well and almost completely healed with only a small open area left.  The wound measures about 1.5 x 0.7 cm with a depth of 3 mm.  Wound bed is clean and healthy with good granulation tissue.  At the perimeter, there is only some firm tissue consistent with a scar information but otherwise the rest of the tissue is soft without cellulitis or induration or pain.  Assessment/Plan: This is a 46 y.o. female s/p excision of right breast nipple and abscess on 01/04/2022.  - Discussed with patient that she is healing very well and now can resort to doing dry gauze dressing changes daily until the wound is fully healed. - Discussed with her that her last surgery we were able to resect all of the abscess and scar tissue from before leaving behind only healthy breast tissue with no evidence of further infection or scarring.  I think with this, now that she is healing well, we may not need to do any further surgery in the form of mastectomy which we had originally thought about prior to these recurrences.  For now, we will follow-up with the patient in 1 month to see how she continues to heal and will discuss further with her whether we can do only watchful waiting versus proceeding with further excision as originally planned.   Patient is in agreement and all of her questions have been answered. - Follow-up in 1 month.   03/04/2022, MD [facility]

## 2022-02-26 ENCOUNTER — Other Ambulatory Visit: Payer: Self-pay

## 2022-02-26 ENCOUNTER — Encounter: Payer: Self-pay | Admitting: Surgery

## 2022-02-26 ENCOUNTER — Ambulatory Visit (INDEPENDENT_AMBULATORY_CARE_PROVIDER_SITE_OTHER): Payer: Self-pay | Admitting: Surgery

## 2022-02-26 VITALS — BP 135/84 | HR 82 | Temp 98.1°F | Ht 65.0 in | Wt 157.2 lb

## 2022-02-26 DIAGNOSIS — N611 Abscess of the breast and nipple: Secondary | ICD-10-CM

## 2022-02-26 DIAGNOSIS — Z09 Encounter for follow-up examination after completed treatment for conditions other than malignant neoplasm: Secondary | ICD-10-CM

## 2022-02-26 NOTE — Progress Notes (Signed)
02/26/2022 ? ?HPI: ?Megan Solis is a 46 y.o. female s/p excision of right breast abscess on 01/04/22 for recurrent right breast abscesses.  She presents today because she started having some right breast pain again about 5 days ago.  Denies any redness or swelling of the breast and reports the tenderness is at the medial edge of the healing wound, at the medial portion of the remnant of the areola.  Denies any fevers, chills, drainage. ? ?Vital signs: ?BP 135/84   Pulse 82   Temp 98.1 ?F (36.7 ?C) (Oral)   Ht 5\' 5"  (1.651 m)   Wt 157 lb 3.2 oz (71.3 kg)   SpO2 98%   BMI 26.16 kg/m?   ? ?Physical Exam: ?Constitutional:  No acute distress ?Breast:  Right breast wound is healed with a thin layer of skin formed closing the wound fully.  This thin skin area is about 1.5 cm x 3 mm in size.  At the medial corner of the wound, there is tenderness to palpation, but no open wound, induration, or erythema.  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 ? ?Assessment/Plan: ?This is a 46 y.o. female s/p excision of right breast abscess ? ?--Discussed with the patient that she may have a clogged pore/tubercle in the breast, which is just medial to the incision and appears to be a pinpoint location for her pain.  No evidence of infection at this point, but recommend that she try warm compresses in the area to help with drainage of the pore.  If there's no improvement, she should let 60 know in case she may need antibiotic treatment or further imaging. ?--For now, warm compresses and follow up as needed. ? ? ?[place], MD ?[facility]  ?

## 2022-02-26 NOTE — Patient Instructions (Addendum)
Use a warm compress to the area 3-4 times a day. If this does not improve by Wednesday call us as we may need to order an antibiotic. If still no improvement we may need to see you on Friday. Just let us know. ? ? ?Follow-up with our office as needed. ? ?Please call and ask to speak with a nurse if you develop questions or concerns. ? ?

## 2022-05-04 ENCOUNTER — Emergency Department: Payer: Self-pay

## 2022-05-04 ENCOUNTER — Emergency Department
Admission: EM | Admit: 2022-05-04 | Discharge: 2022-05-04 | Disposition: A | Discharge status: Home / Self Care | Payer: Self-pay | Attending: Emergency Medicine | Admitting: Emergency Medicine

## 2022-05-04 ENCOUNTER — Other Ambulatory Visit: Payer: Self-pay

## 2022-05-04 DIAGNOSIS — I10 Essential (primary) hypertension: Secondary | ICD-10-CM | POA: Insufficient documentation

## 2022-05-04 DIAGNOSIS — R300 Dysuria: Secondary | ICD-10-CM | POA: Insufficient documentation

## 2022-05-04 DIAGNOSIS — Z720 Tobacco use: Secondary | ICD-10-CM

## 2022-05-04 DIAGNOSIS — K644 Residual hemorrhoidal skin tags: Secondary | ICD-10-CM | POA: Insufficient documentation

## 2022-05-04 DIAGNOSIS — I7090 Unspecified atherosclerosis: Secondary | ICD-10-CM

## 2022-05-04 DIAGNOSIS — K6289 Other specified diseases of anus and rectum: Secondary | ICD-10-CM

## 2022-05-04 DIAGNOSIS — R590 Localized enlarged lymph nodes: Secondary | ICD-10-CM | POA: Insufficient documentation

## 2022-05-04 LAB — COMPREHENSIVE METABOLIC PANEL
ALT: 15 U/L (ref 0–44)
AST: 21 U/L (ref 15–41)
Albumin: 4.4 g/dL (ref 3.5–5.0)
Alkaline Phosphatase: 61 U/L (ref 38–126)
Anion gap: 7 (ref 5–15)
BUN: 11 mg/dL (ref 6–20)
CO2: 28 mmol/L (ref 22–32)
Calcium: 9.6 mg/dL (ref 8.9–10.3)
Chloride: 105 mmol/L (ref 98–111)
Creatinine, Ser: 0.77 mg/dL (ref 0.44–1.00)
GFR, Estimated: >60 mL/min (ref >60)
Glucose, Bld: 139 mg/dL — ABNORMAL HIGH (ref 70–99)
Potassium: 4.4 mmol/L (ref 3.5–5.1)
Sodium: 140 mmol/L (ref 135–145)
Total Bilirubin: 0.6 mg/dL (ref 0.3–1.2)
Total Protein: 7.6 g/dL (ref 6.5–8.1)

## 2022-05-04 LAB — URINALYSIS, ROUTINE W REFLEX MICROSCOPIC
Bilirubin Urine: NEGATIVE
Glucose, UA: NEGATIVE mg/dL
Hgb urine dipstick: NEGATIVE
Ketones, ur: NEGATIVE mg/dL
Leukocytes,Ua: NEGATIVE
Nitrite: NEGATIVE
Protein, ur: NEGATIVE mg/dL
Specific Gravity, Urine: 1.003 — ABNORMAL LOW (ref 1.005–1.030)
pH: 5 (ref 5.0–8.0)

## 2022-05-04 LAB — CBC WITH DIFFERENTIAL/PLATELET
Abs Immature Granulocytes: 0.02 10*3/uL (ref 0.00–0.07)
Basophils Absolute: 0.1 10*3/uL (ref 0.0–0.1)
Basophils Relative: 1 %
Eosinophils Absolute: 0.4 10*3/uL (ref 0.0–0.5)
Eosinophils Relative: 6 %
HCT: 44.8 % (ref 36.0–46.0)
Hemoglobin: 14.8 g/dL (ref 12.0–15.0)
Immature Granulocytes: 0 %
Lymphocytes Relative: 37 %
Lymphs Abs: 2.6 10*3/uL (ref 0.7–4.0)
MCH: 29.5 pg (ref 26.0–34.0)
MCHC: 33 g/dL (ref 30.0–36.0)
MCV: 89.4 fL (ref 80.0–100.0)
Monocytes Absolute: 0.8 10*3/uL (ref 0.1–1.0)
Monocytes Relative: 11 %
Neutro Abs: 3.2 10*3/uL (ref 1.7–7.7)
Neutrophils Relative %: 45 %
Platelets: 274 10*3/uL (ref 150–400)
RBC: 5.01 MIL/uL (ref 3.87–5.11)
RDW: 13.2 % (ref 11.5–15.5)
WBC: 7.1 10*3/uL (ref 4.0–10.5)
nRBC: 0 % (ref 0.0–0.2)

## 2022-05-04 LAB — HCG, QUANTITATIVE, PREGNANCY: hCG, Beta Chain, Quant, S: 4 m[IU]/mL (ref <5)

## 2022-05-04 MED ORDER — ONDANSETRON HCL 4 MG/2ML IJ SOLN
4.0000 mg | Freq: Once | INTRAMUSCULAR | Status: AC
  Administered 2022-05-04: 4 mg via INTRAVENOUS
  Filled 2022-05-04: qty 2

## 2022-05-04 MED ORDER — AMOXICILLIN-POT CLAVULANATE 875-125 MG PO TABS: 1.0000 | ORAL_TABLET | Freq: Two times a day (BID) | ORAL | 0 refills | Status: AC

## 2022-05-04 MED ORDER — FENTANYL CITRATE PF 50 MCG/ML IJ SOSY
50.0000 ug | PREFILLED_SYRINGE | Freq: Once | INTRAMUSCULAR | Status: AC
Start: 2022-05-04 — End: 2022-05-04
  Administered 2022-05-04: 50 ug via INTRAVENOUS
  Filled 2022-05-04: qty 1

## 2022-05-04 MED ORDER — OXYCODONE-ACETAMINOPHEN 5-325 MG PO TABS: 1.0000 | ORAL_TABLET | Freq: Three times a day (TID) | ORAL | 0 refills | Status: AC | PRN

## 2022-05-04 MED ORDER — IOHEXOL 300 MG/ML  SOLN
100.0000 mL | Freq: Once | INTRAMUSCULAR | Status: AC | PRN
  Administered 2022-05-04: 100 mL via INTRAVENOUS

## 2022-05-04 MED ORDER — FENTANYL CITRATE PF 50 MCG/ML IJ SOSY
50.0000 ug | PREFILLED_SYRINGE | Freq: Once | INTRAMUSCULAR | Status: AC
  Administered 2022-05-04: 50 ug via INTRAVENOUS
  Filled 2022-05-04: qty 1

## 2022-05-04 MED ORDER — SODIUM CHLORIDE 0.9 % IV BOLUS (SEPSIS)
1000.0000 mL | Freq: Once | INTRAVENOUS | Status: AC
  Administered 2022-05-04: 1000 mL via INTRAVENOUS

## 2022-05-04 NOTE — ED Provider Notes (Signed)
I think it is patient approximately 0 700.  Please see outgoing providers note for full details regarding patient's initial evaluation and assessment.  In brief patient presents for evaluation of 3 days of some bilateral inguinal lymphadenopathy.  No burning or itching abdominal pain, back pain or abnormal vaginal discharge.  Patient reportedly had some external nonthrombosed hemorrhoids but is unable to tolerate exam.  She is reporting some scant blood mixed with her.  Plan is to obtain CT chest abdomen pelvis as initial labs and urine are otherwise unremarkable.  CT obtained reviewed negative except abscess or other clear pelvic process.  There is evidence of some atherosclerosis and CAD which I discussed with patient.  Possibly lymphadenopathy is reactive in the setting of acute infectious process.  She does have a history of recurrent skin infections.  Discussed that she must get local PCP care and GI follow-up to undergo routine cancer screening including Pap smears and colonoscopy in the meantime we will trial a course of Augmentin to see if this helps infectious source.  Will write for short course of analgesia as patient states has been extremely painful given Tylenol ibuprofen.  Discussed returning for any new or worsening symptoms.  Discharged in stable condition. ?  ?Gilles Chiquito, MD ?05/04/22 [phone] ? ?

## 2022-05-04 NOTE — Discharge Instructions (Signed)
Your CT today showed: ?IMPRESSION: ?1. Mild asymmetric enlargement of a left inguinal lymph node ?correlating with the palpable complaint, usually reactive. ?2. No acute intrathoracic or intra-abdominal finding. ?3. Age advanced atherosclerosis including the coronary arteries. ?

## 2022-05-04 NOTE — ED Notes (Signed)
Does c/o of same pain, MD aware, new orders placed, please see MAR.  ?

## 2022-05-04 NOTE — ED Notes (Signed)
D/C, new RX and reasons to return to ED discussed with pt, pt verbalized understanding. Pt educated on importance of obtaining a PCP, pt verbalized understanding. NAD noted. Pt ambulatory with steady gait on D/C.  ?

## 2022-05-04 NOTE — ED Notes (Signed)
Pt to CT

## 2022-05-04 NOTE — ED Triage Notes (Signed)
Ambulatory to triage with c/o knot to left groin area. Pain worsens with walking. Pt states she noticed apx 3 days ago.  ?Pt also c/o "something is growing around my butthole and it feels like its closing it up." Pt reports this has been an ongoing issue for apx 1 year but has worsened lately.  ?

## 2022-05-04 NOTE — ED Provider Notes (Signed)
? ?[facility] ?Provider Note ? ? ? Event Date/Time  ? First MD Initiated Contact with Patient 05/04/22 0557   ?  (approximate) ? ? ?History  ? ?Groin Pain ? ? ?HPI ? ?Megan Solis is a 46 y.o. female with history of hypertension, alcohol abuse, multiple previous abscesses who presents to the emergency department with complaints of bilateral inguinal lymphadenopathy that is tender to palpation and painful with walking for the past 3 days.  She also reports intermittent lower abdominal pain and pain with urination.  No fevers, nausea, vomiting, diarrhea.  She also states that she has had rectal pain that is progressively worsening over the past year.  She feels like something is growing within her rectum causing it to "close up".  She states it is very painful to have a bowel movement but she is able to pass stool and gas.  States she previously had a history of genital warts as a teenager and had to have them surgically removed.  She denies any abnormal vaginal bleeding or discharge. ? ?She denies history of cancer.  States she recently had her right nipple removed due to MRSA.  She states her father died of cancer but no family history of lymphoma or leukemia.  She denies any weight loss but states that she has "always" had night sweats for a "long time".  She denies history of mammography or colonoscopy. ? ? ?History provided by patient. ? ? ? ?Past Medical History:  ?Diagnosis Date  ? Abscess of right breast   ? Alcohol abuse   ? Hypertension   ? MRSA (methicillin resistant Staphylococcus aureus)   ? Pneumonia   ? Seizures (HCC)   ? only when coming off alcohol  ? Tobacco abuse   ? ? ?Past Surgical History:  ?Procedure Laterality Date  ? adnoids    ? removed and tubes placed  ? BREAST CYST EXCISION Right 01/04/2022  ? Procedure: CYST EXCISION BREAST, abscess;  Surgeon: [name], MD;  Location: [facility] ORS;  Service: General;  Laterality: Right;  ? CESAREAN SECTION  2005  ? 2003  ?  INCISION AND DRAINAGE Right   ? R breast I&D  ? INCISION AND DRAINAGE ABSCESS Right 11/30/2021  ? Procedure: INCISION AND DRAINAGE ABSCESS, right breast abscess;  Surgeon: [name], MD;  Location: [facility] ORS;  Service: General;  Laterality: Right;  Provider requesting 1.5 hours / 90 minutes for procedure.  ? TUBAL LIGATION    ? ? ?MEDICATIONS:  ?Prior to Admission medications   ?Medication Sig Start Date End Date Taking? Authorizing Provider  ?acetaminophen (TYLENOL) 500 MG tablet Take 2 tablets (1,000 mg total) by mouth every 6 (six) hours as needed for mild pain. 11/30/21   [name], MD  ?ibuprofen (ADVIL) 800 MG tablet Take 1 tablet (800 mg total) by mouth every 8 (eight) hours as needed for moderate pain. 11/30/21   [name], MD  ?omeprazole (PRILOSEC) 20 MG capsule Take 20 mg by mouth daily as needed (heartburn/indigestion.).    [provider]  ? ? ?Physical Exam  ? ?Triage Vital Signs: ?ED Triage Vitals [05/04/22 0551]  ?Enc Vitals Group  ?   BP (!) 135/97  ?   Pulse Rate 96  ?   Resp 18  ?   Temp 97.8 ?F (36.6 ?C)  ?   Temp Source Oral  ?   SpO2 96 %  ?   Weight 155 lb (70.3 kg)  ?   Height 5'  5" (1.651 m)  ?   Head Circumference   ?   Peak Flow   ?   Pain Score 6  ?   Pain Loc   ?   Pain Edu?   ?   Excl. in GC?   ? ? ?Most recent vital signs: ?Vitals:  ? 05/04/22 0551  ?BP: (!) 135/97  ?Pulse: 96  ?Resp: 18  ?Temp: 97.8 ?F (36.6 ?C)  ?SpO2: 96%  ? ? ?CONSTITUTIONAL: Alert and oriented and responds appropriately to questions.  In no distress, afebrile ?HEAD: Normocephalic, atraumatic ?EYES: Conjunctivae clear, pupils appear equal, sclera nonicteric ?ENT: normal nose; moist mucous membranes ?NECK: Supple, normal ROM, 1 small palpable lymph node in the left anterior neck but no other significant lymphadenopathy, no thyromegaly, trachea midline ?CARD: RRR; S1 and S2 appreciated; no murmurs, no clicks, no rubs, no gallops ?RESP: Normal chest excursion without splinting or tachypnea; breath  sounds clear and equal bilaterally; no wheezes, no rhonchi, no rales, no hypoxia or respiratory distress, speaking full sentences ?ABD/GI: Normal bowel sounds; non-distended; soft, tender in the lower abdomen without guarding or rebound, bilateral inguinal lymphadenopathy noted that is tender to palpation without overlying redness, warmth, ecchymosis, soft tissue swelling, necrosis, crepitus.  Scattered folliculitis without cellulitis or abscess noted to the external genitalia. ?RECTAL: Patient has nonthrombosed external hemorrhoids on exam.  She is very tender to palpation on rectal exam and I am not able to perform internal exam due to her intolerance.  I do not appreciate redness, warmth, crepitus, ecchymosis, fluctuance, induration.  No genital warts present.  Chaperone present. ?BACK: The back appears normal ?EXT: Normal ROM in all joints; no deformity noted, no edema; no cyanosis, nontender to palpation, 2+ DP pulses bilaterally, normal gait; no axillary lymphadenopathy ?SKIN: Normal color for age and race; warm; no rash on exposed skin ?NEURO: Moves all extremities equally, normal speech ?PSYCH: The patient's mood and manner are appropriate. ? ? ?ED Results / Procedures / Treatments  ? ?LABS: ?(all labs ordered are listed, but only abnormal results are displayed) ?Labs Reviewed  ?CBC WITH DIFFERENTIAL/PLATELET  ?COMPREHENSIVE METABOLIC PANEL  ?URINALYSIS, ROUTINE W REFLEX MICROSCOPIC  ?HCG, QUANTITATIVE, PREGNANCY  ? ? ? ?EKG: ? ? ?RADIOLOGY: ?My personal review and interpretation of imaging:  CT pending ? ?I have personally reviewed all radiology reports.   ?No results found. ? ? ?PROCEDURES: ? ?Critical Care performed: No ? ? ? ?Procedures ? ? ? ?IMPRESSION / MDM / ASSESSMENT AND PLAN / ED COURSE  ?I reviewed the triage vital signs and the nursing notes. ? ? ? ?Patient here with complaints of lower abdominal pain, bilateral inguinal lymphadenopathy and rectal pain. ? ? ? ? ?DIFFERENTIAL DIAGNOSIS (includes  but not limited to):   Internal hemorrhoids, anal fissure, genital warts, perirectal abscess, perianal abscess, proctitis, colitis, diverticulitis, appendicitis, UTI, lymphadenitis, lymphoma, leukemia, colon or rectal cancer with metastasis, other malignancy.  No sign of lower extremity DVT, arterial obstruction, cellulitis.  No sign of inguinal cellulitis, abscess.  She does have some mild folliculitis over her external genitalia but no signs of superimposed infection that I would suspect would cause this level of lymphadenopathy and tenderness. ? ? ?PLAN: We will obtain CBC, CMP, hCG, urinalysis and a CT of the chest abdomen pelvis with IV contrast.  Will give IV fluids, pain and nausea medicine.  Exam limited due to patient's intolerance of full examination of her rectum due to pain. ? ? ?MEDICATIONS GIVEN IN ED: ?Medications  ?sodium chloride 0.9 %  bolus 1,000 mL (1,000 mLs Intravenous New Bag/Given 05/04/22 0612)  ?fentaNYL (SUBLIMAZE) injection 50 mcg (50 mcg Intravenous Given 05/04/22 0611)  ?ondansetron [facility]) injection 4 mg (4 mg Intravenous Given 05/04/22 0611)  ? ? ? ?ED COURSE: Labs show no leukocytosis, normal hemoglobin, normal platelets.  Normal electrolytes, renal function, LFTs.  hCG, urinalysis, CT of the abdomen pelvis pending.  Signed out to oncoming ED physician. ? ? ?I reviewed all nursing notes and pertinent previous records as available.  I have reviewed and interpreted any and all EKGs, lab and urine results, imaging and radiology reports (as available). ? ? ? ? ?CONSULTS: Dispo pending further work-up. ? ? ?OUTSIDE RECORDS REVIEWED: Reviewed last office visit with Vicie Mutters on 12/20/2020. ? ? ? ? ? ? ? ? ?FINAL CLINICAL IMPRESSION(S) / ED DIAGNOSES  ? ?Final diagnoses:  ?Inguinal lymphadenopathy  ?Rectal pain  ? ? ? ?Rx / DC Orders  ? ?ED Discharge Orders   ? ? None  ? ?  ? ? ? ?Note:  This document was prepared using Dragon voice recognition software and may include  unintentional dictation errors. ?  ?Dudley Cooley, Layla Maw, DO ?05/04/22 9811 ? ?

## 2022-10-08 ENCOUNTER — Ambulatory Visit: Payer: Self-pay | Admitting: Surgery

## 2022-12-07 ENCOUNTER — Encounter: Payer: Self-pay | Admitting: Emergency Medicine

## 2022-12-07 ENCOUNTER — Other Ambulatory Visit: Payer: Self-pay

## 2022-12-07 ENCOUNTER — Emergency Department: Payer: Commercial Managed Care - PPO

## 2022-12-07 ENCOUNTER — Emergency Department
Admission: EM | Admit: 2022-12-07 | Discharge: 2022-12-07 | Disposition: A | Discharge status: Home / Self Care | Payer: Commercial Managed Care - PPO | Attending: Emergency Medicine | Admitting: Emergency Medicine

## 2022-12-07 DIAGNOSIS — J9801 Acute bronchospasm: Secondary | ICD-10-CM | POA: Diagnosis not present

## 2022-12-07 DIAGNOSIS — Z1152 Encounter for screening for COVID-19: Secondary | ICD-10-CM | POA: Diagnosis not present

## 2022-12-07 DIAGNOSIS — J101 Influenza due to other identified influenza virus with other respiratory manifestations: Secondary | ICD-10-CM

## 2022-12-07 DIAGNOSIS — I10 Essential (primary) hypertension: Secondary | ICD-10-CM | POA: Insufficient documentation

## 2022-12-07 DIAGNOSIS — R059 Cough, unspecified: Secondary | ICD-10-CM | POA: Diagnosis present

## 2022-12-07 LAB — RESP PANEL BY RT-PCR (RSV, FLU A&B, COVID)  RVPGX2
Influenza A by PCR: POSITIVE — AB
Influenza B by PCR: NEGATIVE
Resp Syncytial Virus by PCR: NEGATIVE
SARS Coronavirus 2 by RT PCR: NEGATIVE

## 2022-12-07 MED ORDER — PREDNISONE 10 MG (21) PO TBPK: ORAL_TABLET | ORAL | 0 refills | Status: DC

## 2022-12-07 MED ORDER — OSELTAMIVIR PHOSPHATE 75 MG PO CAPS: 75.0000 mg | ORAL_CAPSULE | Freq: Two times a day (BID) | ORAL | 0 refills | Status: AC

## 2022-12-07 MED ORDER — IBUPROFEN 800 MG PO TABS
800.0000 mg | ORAL_TABLET | Freq: Once | ORAL | Status: AC
  Administered 2022-12-07: 800 mg via ORAL
  Filled 2022-12-07: qty 1

## 2022-12-07 MED ORDER — ALBUTEROL SULFATE HFA 108 (90 BASE) MCG/ACT IN AERS: 2.0000 | INHALATION_SPRAY | RESPIRATORY_TRACT | 0 refills | Status: DC | PRN

## 2022-12-07 MED ORDER — ALBUTEROL SULFATE (2.5 MG/3ML) 0.083% IN NEBU
5.0000 mg | INHALATION_SOLUTION | Freq: Once | RESPIRATORY_TRACT | Status: AC
Start: 2022-12-07 — End: 2022-12-07
  Administered 2022-12-07: 5 mg via RESPIRATORY_TRACT
  Filled 2022-12-07: qty 6

## 2022-12-07 MED ORDER — ONDANSETRON 4 MG PO TBDP
4.0000 mg | ORAL_TABLET | Freq: Four times a day (QID) | ORAL | 0 refills | Status: DC | PRN
Start: 2022-12-07 — End: 2023-10-26

## 2022-12-07 MED ORDER — PREDNISONE 20 MG PO TABS
60.0000 mg | ORAL_TABLET | Freq: Once | ORAL | Status: AC
  Administered 2022-12-07: 60 mg via ORAL
  Filled 2022-12-07: qty 3

## 2022-12-07 NOTE — Discharge Instructions (Addendum)
You may alternate Tylenol 1000 mg every 6 hours as needed for fever and pain and ibuprofen 800 mg every 6-8 hours as needed for fever and pain. Please rest and drink plenty of fluids. This is a viral illness causing your symptoms. You do not need antibiotics for a virus. You may use over-the-counter nasal saline spray and Afrin nasal saline spray as needed for nasal congestion. Please do not use Afrin for more than 3 days in a row. You may use guaifenesin and dextromethorphan as needed for cough.  You may use lozenges and Chloraseptic spray to help with sore throat.  Warm salt water gargles can also help with sore throat.  You may use over-the-counter Unisom (doxyalamine) or Benadryl (diphenhydramine) to help with sleep.  Please note that some combination medicines such as DayQuil and NyQuil have multiple medications in them.  Please make sure you look at all labels to ensure that you are not taking too much of any one particular medication.  Symptoms from a virus may take 7-14 days to run its course.  The flu is treated like any other virus with supportive measures as listed above. We have provided a prescription for Tamiflu.  This medication has to be taken within the first 48 hours of symptoms to be effective.  Tamiflu has many side effects including nausea, vomiting and diarrhea.    Steps to find a Primary Care Provider (PCP):  Call [phone] or [phone] to access "[place] Find a Doctor Service."  2.  You may also go on the [facility] website at [email or web address]

## 2022-12-07 NOTE — ED Provider Notes (Signed)
[facility] Provider Note    Event Date/Time   First MD Initiated Contact with Patient 12/07/22 0207     (approximate)   History   Cough   HPI  Megan Solis is a 46 y.o. female history of hypertension, alcohol abuse who presents to the emergency department with cough, congestion, body aches, fevers for the past 2 days.  No vomiting or diarrhea.  Has not been vaccinated for the flu.   History provided by patient and husband.    Past Medical History:  Diagnosis Date   Abscess of right breast    Alcohol abuse    Hypertension    MRSA (methicillin resistant Staphylococcus aureus)    Pneumonia    Seizures ([place])    only when coming off alcohol   Tobacco abuse     Past Surgical History:  Procedure Laterality Date   adnoids     removed and tubes placed   BREAST CYST EXCISION Right 01/04/2022   Procedure: CYST EXCISION BREAST, abscess;  Surgeon: Olean Ree, MD;  Location: [facility] ORS;  Service: General;  Laterality: Right;   CESAREAN SECTION  2005   2003   INCISION AND DRAINAGE Right    R breast I&D   INCISION AND DRAINAGE ABSCESS Right 11/30/2021   Procedure: INCISION AND DRAINAGE ABSCESS, right breast abscess;  Surgeon: Olean Ree, MD;  Location: [facility] ORS;  Service: General;  Laterality: Right;  Provider requesting 1.5 hours / 90 minutes for procedure.   TUBAL LIGATION      MEDICATIONS:  Prior to Admission medications   Medication Sig Start Date End Date Taking? Authorizing Provider  acetaminophen (TYLENOL) 500 MG tablet Take 2 tablets (1,000 mg total) by mouth every 6 (six) hours as needed for mild pain. 11/30/21   Olean Ree, MD  ibuprofen (ADVIL) 800 MG tablet Take 1 tablet (800 mg total) by mouth every 8 (eight) hours as needed for moderate pain. 11/30/21   Olean Ree, MD  omeprazole (PRILOSEC) 20 MG capsule Take 20 mg by mouth daily as needed (heartburn/indigestion.).    [provider]    Physical Exam   Triage  Vital Signs: ED Triage Vitals  Enc Vitals Group     BP 12/07/22 0020 129/83     Pulse Rate 12/07/22 0020 90     Resp 12/07/22 0020 20     Temp 12/07/22 0020 98.2 F (36.8 C)     Temp Source 12/07/22 0020 Oral     SpO2 12/07/22 0020 95 %     Weight 12/07/22 0016 161 lb (73 kg)     Height 12/07/22 0016 5\' 4"  (1.626 m)     Head Circumference --      Peak Flow --      Pain Score 12/07/22 0015 8     Pain Loc --      Pain Edu? --      Excl. in [place]? --     Most recent vital signs: Vitals:   12/07/22 0300 12/07/22 0305  BP: 117/82   Pulse: 78   Resp: 19   Temp:  99.4 F (37.4 C)  SpO2: 95%     CONSTITUTIONAL: Alert and oriented and responds appropriately to questions. Well-appearing; well-nourished HEAD: Normocephalic, atraumatic EYES: Conjunctivae clear, pupils appear equal, sclera nonicteric ENT: normal nose; moist mucous membranes NECK: Supple, normal ROM CARD: RRR; S1 and S2 appreciated; no murmurs, no clicks, no rubs, no gallops RESP: Normal chest excursion without splinting or tachypnea; breath  sounds equal bilaterally; scattered expiratory wheezing.  No rhonchi or rales.  No respiratory distress or hypoxia.  Speaking full sentences. ABD/GI: Normal bowel sounds; non-distended; soft, non-tender, no rebound, no guarding, no peritoneal signs BACK: The back appears normal EXT: Normal ROM in all joints; no deformity noted, no edema; no cyanosis SKIN: Normal color for age and race; warm; no rash on exposed skin NEURO: Moves all extremities equally, normal speech PSYCH: The patient's mood and manner are appropriate.   ED Results / Procedures / Treatments   LABS: (all labs ordered are listed, but only abnormal results are displayed) Labs Reviewed  RESP PANEL BY RT-PCR (RSV, FLU A&B, COVID)  RVPGX2 - Abnormal; Notable for the following components:      Result Value   Influenza A by PCR POSITIVE (*)    All other components within normal limits     EKG:  RADIOLOGY: My  personal review and interpretation of imaging: X-ray clear.  I have personally reviewed all radiology reports.   DG Chest 2 View  Result Date: 12/07/2022 CLINICAL DATA:  Cough since yesterday. Headaches, body aches, congestion, and wheezing. EXAM: CHEST - 2 VIEW COMPARISON:  04/06/2018 FINDINGS: The heart size and mediastinal contours are within normal limits. Both lungs are clear. The visualized skeletal structures are unremarkable. IMPRESSION: No active cardiopulmonary disease. Electronically Signed   By: Burman Nieves M.D.   On: 12/07/2022 00:46     PROCEDURES:  Critical Care performed: No      Procedures    IMPRESSION / MDM / ASSESSMENT AND PLAN / ED COURSE  I reviewed the triage vital signs and the nursing notes.    Patient here with cough, congestion, fevers and bodyaches.  The patient is on the cardiac monitor to evaluate for evidence of arrhythmia and/or significant heart rate changes.   DIFFERENTIAL DIAGNOSIS (includes but not limited to):   Flu, COVID, RSV, other viral URI, pneumonia, bronchospasm   Patient's presentation is most consistent with acute presentation with potential threat to life or bodily function.   PLAN: COVID and RSV negative.  Tested positive for influenza A.  Chest x-ray reviewed and interpreted by myself and the radiologist and shows no acute abnormality.  No hypoxia here.  She is wheezing.  Will give albuterol treatment, prednisone and reassess.   MEDICATIONS GIVEN IN ED: Medications  albuterol (PROVENTIL) (2.5 MG/3ML) 0.083% nebulizer solution 5 mg (5 mg Nebulization Given 12/07/22 0243)  ibuprofen (ADVIL) tablet 800 mg (800 mg Oral Given 12/07/22 0243)  predniSONE (DELTASONE) tablet 60 mg (60 mg Oral Given 12/07/22 0243)     ED COURSE: Lungs are clear to auscultation.  Patient reports feeling much better.  I feel she is safe to be discharged.  Discussed risk and benefits of Tamiflu.  She would like to proceed with a prescription.   Will also discharge on steroids and with an inhaler.   At this time, I do not feel there is any life-threatening condition present. I reviewed all nursing notes, vitals, pertinent previous records.  All lab and urine results, EKGs, imaging ordered have been independently reviewed and interpreted by myself.  I reviewed all available radiology reports from any imaging ordered this visit.  Based on my assessment, I feel the patient is safe to be discharged home without further emergent workup and can continue workup as an outpatient as needed. Discussed all findings, treatment plan as well as usual and customary return precautions.  They verbalize understanding and are comfortable with this plan.  Outpatient follow-up has been provided as needed.  All questions have been answered.    CONSULTS:  none   OUTSIDE RECORDS REVIEWED: Reviewed last general surgery note in March 2023 for breast abscess.       FINAL CLINICAL IMPRESSION(S) / ED DIAGNOSES   Final diagnoses:  Influenza A  Bronchospasm     Rx / DC Orders   ED Discharge Orders          Ordered    oseltamivir (TAMIFLU) 75 MG capsule  2 times daily        12/07/22 0300    ondansetron (ZOFRAN-ODT) 4 MG disintegrating tablet  Every 6 hours PRN        12/07/22 0300    albuterol (VENTOLIN HFA) 108 (90 Base) MCG/ACT inhaler  Every 4 hours PRN        12/07/22 0300    predniSONE (STERAPRED UNI-PAK 21 TAB) 10 MG (21) TBPK tablet        12/07/22 0300             Note:  This document was prepared using Dragon voice recognition software and may include unintentional dictation errors.   Kerrigan Glendening, Delice Bison, DO 12/07/22 [phone]

## 2022-12-07 NOTE — ED Triage Notes (Signed)
Patient ambulatory to triage with steady gait, without difficulty or distress noted; pt reports since yesterday having prod cough clear sputum, HA, body aches, congestion, wheezing; home COVID test negative

## 2023-10-26 ENCOUNTER — Other Ambulatory Visit: Payer: Self-pay

## 2023-10-26 ENCOUNTER — Emergency Department
Admission: EM | Admit: 2023-10-26 | Discharge: 2023-10-26 | Disposition: A | Discharge status: Home / Self Care | Payer: Self-pay | Attending: Emergency Medicine | Admitting: Emergency Medicine

## 2023-10-26 ENCOUNTER — Emergency Department: Payer: Self-pay

## 2023-10-26 DIAGNOSIS — R519 Headache, unspecified: Secondary | ICD-10-CM | POA: Insufficient documentation

## 2023-10-26 DIAGNOSIS — R0602 Shortness of breath: Secondary | ICD-10-CM

## 2023-10-26 DIAGNOSIS — F17210 Nicotine dependence, cigarettes, uncomplicated: Secondary | ICD-10-CM | POA: Insufficient documentation

## 2023-10-26 DIAGNOSIS — J9801 Acute bronchospasm: Secondary | ICD-10-CM | POA: Insufficient documentation

## 2023-10-26 DIAGNOSIS — Z20822 Contact with and (suspected) exposure to covid-19: Secondary | ICD-10-CM | POA: Insufficient documentation

## 2023-10-26 DIAGNOSIS — J069 Acute upper respiratory infection, unspecified: Secondary | ICD-10-CM | POA: Insufficient documentation

## 2023-10-26 LAB — BASIC METABOLIC PANEL
Anion gap: 10 (ref 5–15)
BUN: 11 mg/dL (ref 6–20)
CO2: 24 mmol/L (ref 22–32)
Calcium: 9.3 mg/dL (ref 8.9–10.3)
Chloride: 104 mmol/L (ref 98–111)
Creatinine, Ser: 0.65 mg/dL (ref 0.44–1.00)
GFR, Estimated: >60 mL/min (ref >60)
Glucose, Bld: 97 mg/dL (ref 70–99)
Potassium: 3.9 mmol/L (ref 3.5–5.1)
Sodium: 138 mmol/L (ref 135–145)

## 2023-10-26 LAB — CBC
HCT: 43.2 % (ref 36.0–46.0)
Hemoglobin: 14.2 g/dL (ref 12.0–15.0)
MCH: 29.2 pg (ref 26.0–34.0)
MCHC: 32.9 g/dL (ref 30.0–36.0)
MCV: 88.7 fL (ref 80.0–100.0)
Platelets: 340 10*3/uL (ref 150–400)
RBC: 4.87 MIL/uL (ref 3.87–5.11)
RDW: 14.1 % (ref 11.5–15.5)
WBC: 7.7 10*3/uL (ref 4.0–10.5)
nRBC: 0 % (ref 0.0–0.2)

## 2023-10-26 LAB — RESP PANEL BY RT-PCR (RSV, FLU A&B, COVID)  RVPGX2
Influenza A by PCR: NEGATIVE
Influenza B by PCR: NEGATIVE
Resp Syncytial Virus by PCR: NEGATIVE
SARS Coronavirus 2 by RT PCR: NEGATIVE

## 2023-10-26 LAB — TROPONIN I (HIGH SENSITIVITY): Troponin I (High Sensitivity): 3 ng/L (ref <18)

## 2023-10-26 LAB — POC URINE PREG, ED: Preg Test, Ur: NEGATIVE

## 2023-10-26 MED ORDER — IPRATROPIUM-ALBUTEROL 0.5-2.5 (3) MG/3ML IN SOLN
3.0000 mL | Freq: Once | RESPIRATORY_TRACT | Status: AC
Start: 2023-10-26 — End: 2023-10-26
  Administered 2023-10-26: 3 mL via RESPIRATORY_TRACT
  Filled 2023-10-26: qty 3

## 2023-10-26 MED ORDER — PREDNISONE 50 MG PO TABS: 50.0000 mg | ORAL_TABLET | Freq: Every day | ORAL | 0 refills | Status: AC

## 2023-10-26 MED ORDER — PREDNISONE 20 MG PO TABS
60.0000 mg | ORAL_TABLET | Freq: Once | ORAL | Status: AC
  Administered 2023-10-26: 60 mg via ORAL
  Filled 2023-10-26: qty 3

## 2023-10-26 MED ORDER — ALBUTEROL SULFATE HFA 108 (90 BASE) MCG/ACT IN AERS: 2.0000 | INHALATION_SPRAY | Freq: Four times a day (QID) | RESPIRATORY_TRACT | 2 refills | Status: AC | PRN

## 2023-10-26 NOTE — ED Triage Notes (Signed)
Pt here with SOB x3 days. Pt also endorses chest pain. Pt  states she has been sick as well with a headache. Pt also is a smoker. Pt has a productive cough as well. Pt ambulatory to triage.

## 2023-10-26 NOTE — ED Provider Notes (Signed)
[facility] Provider Note    Event Date/Time   First MD Initiated Contact with Patient 10/26/23 [phone]     (approximate)   History   Shortness of Breath   HPI  Megan Solis is a 47 y.o. female who presents with complaints of shortness of breath for about 2 to 3 days, also endorses some chest tightness, she has had congestion, headache, productive cough.  She does smoke cigarettes.     Physical Exam   Triage Vital Signs: ED Triage Vitals [10/26/23 0848]  Encounter Vitals Group     BP 128/87     Systolic BP Percentile      Diastolic BP Percentile      Pulse Rate 83     Resp 16     Temp 98.1 F (36.7 C)     Temp Source Oral     SpO2 97 %     Weight 73 kg (160 lb 15 oz)     Height 1.626 m (5\' 4" )     Head Circumference      Peak Flow      Pain Score 6     Pain Loc      Pain Education      Exclude from Growth Chart     Most recent vital signs: Vitals:   10/26/23 0848  BP: 128/87  Pulse: 83  Resp: 16  Temp: 98.1 F (36.7 C)  SpO2: 97%     General: Awake, no distress.  CV:  Good peripheral perfusion.  Resp:  Normal effort.  Scattered wheezing on exam Abd:  No distention.  Other:     ED Results / Procedures / Treatments   Labs (all labs ordered are listed, but only abnormal results are displayed) Labs Reviewed  RESP PANEL BY RT-PCR (RSV, FLU A&B, COVID)  RVPGX2  BASIC METABOLIC PANEL  CBC  POC URINE PREG, ED  TROPONIN I (HIGH SENSITIVITY)  TROPONIN I (HIGH SENSITIVITY)     EKG  ED ECG REPORT I, Jene Every, the attending physician, personally viewed and interpreted this ECG.  Date: 10/26/2023  Rhythm: normal sinus rhythm QRS Axis: normal Intervals: normal ST/T Wave abnormalities: normal Narrative Interpretation: no evidence of acute ischemia    RADIOLOGY Chest x-ray viewed interpret by me, no acute abnormality    PROCEDURES:  Critical Care performed:   Procedures   MEDICATIONS ORDERED IN  ED: Medications  ipratropium-albuterol (DUONEB) 0.5-2.5 (3) MG/3ML nebulizer solution 3 mL (3 mLs Nebulization Given 10/26/23 0940)  ipratropium-albuterol (DUONEB) 0.5-2.5 (3) MG/3ML nebulizer solution 3 mL (3 mLs Nebulization Given 10/26/23 0940)  predniSONE (DELTASONE) tablet 60 mg (60 mg Oral Given 10/26/23 0938)  ipratropium-albuterol (DUONEB) 0.5-2.5 (3) MG/3ML nebulizer solution 3 mL (3 mLs Nebulization Given 10/26/23 1003)     IMPRESSION / MDM / ASSESSMENT AND PLAN / ED COURSE  I reviewed the triage vital signs and the nursing notes. Patient's presentation is most consistent with acute presentation with potential threat to life or bodily function.  Patient presents with shortness of breath as detailed above, differential includes bronchospasm, COPD, pneumonia, upper respiratory infection  Lab work is generally reassuring, chest x-ray without evidence of pneumonia  Scattered wheezing on exam to suggest bronchospasm, will treat with DuoNebs  ----------------------------------------- 10:02 AM on 10/26/2023 ----------------------------------------- Improvement after initial DuoNeb's, will do another ----------------------------------------- 10:34 AM on 10/26/2023 ----------------------------------------- Patient feeling much better, appropriate for discharge at this time, no indication for admission, return precautions discussed     FINAL  CLINICAL IMPRESSION(S) / ED DIAGNOSES   Final diagnoses:  Upper respiratory tract infection, unspecified type  Bronchospasm  SOB (shortness of breath)     Rx / DC Orders   ED Discharge Orders          Ordered    predniSONE (DELTASONE) 50 MG tablet  Daily with breakfast        10/26/23 1030    albuterol (VENTOLIN HFA) 108 (90 Base) MCG/ACT inhaler  Every 6 hours PRN        10/26/23 1030             Note:  This document was prepared using Dragon voice recognition software and may include unintentional dictation errors.    Jene Every, MD 10/26/23 1034

## 2024-01-18 ENCOUNTER — Emergency Department
Admission: EM | Admit: 2024-01-18 | Discharge: 2024-01-18 | Disposition: A | Discharge status: Home / Self Care | Payer: Self-pay | Attending: Emergency Medicine | Admitting: Emergency Medicine

## 2024-01-18 ENCOUNTER — Emergency Department: Payer: Self-pay

## 2024-01-18 ENCOUNTER — Other Ambulatory Visit: Payer: Self-pay

## 2024-01-18 ENCOUNTER — Encounter: Payer: Self-pay | Admitting: Intensive Care

## 2024-01-18 DIAGNOSIS — R6889 Other general symptoms and signs: Secondary | ICD-10-CM

## 2024-01-18 DIAGNOSIS — R519 Headache, unspecified: Secondary | ICD-10-CM | POA: Insufficient documentation

## 2024-01-18 DIAGNOSIS — J209 Acute bronchitis, unspecified: Secondary | ICD-10-CM | POA: Insufficient documentation

## 2024-01-18 DIAGNOSIS — Z20822 Contact with and (suspected) exposure to covid-19: Secondary | ICD-10-CM | POA: Insufficient documentation

## 2024-01-18 DIAGNOSIS — J189 Pneumonia, unspecified organism: Secondary | ICD-10-CM

## 2024-01-18 DIAGNOSIS — F17200 Nicotine dependence, unspecified, uncomplicated: Secondary | ICD-10-CM | POA: Insufficient documentation

## 2024-01-18 LAB — RESP PANEL BY RT-PCR (RSV, FLU A&B, COVID)  RVPGX2
Influenza A by PCR: NEGATIVE
Influenza B by PCR: NEGATIVE
Resp Syncytial Virus by PCR: NEGATIVE
SARS Coronavirus 2 by RT PCR: NEGATIVE

## 2024-01-18 MED ORDER — ACETAMINOPHEN 325 MG PO TABS
650.0000 mg | ORAL_TABLET | Freq: Once | ORAL | Status: AC
Start: 2024-01-18 — End: 2024-01-18
  Administered 2024-01-18: 650 mg via ORAL
  Filled 2024-01-18: qty 2

## 2024-01-18 MED ORDER — IPRATROPIUM-ALBUTEROL 0.5-2.5 (3) MG/3ML IN SOLN
3.0000 mL | Freq: Once | RESPIRATORY_TRACT | Status: AC
Start: 2024-01-18 — End: 2024-01-18
  Administered 2024-01-18: 3 mL via RESPIRATORY_TRACT
  Filled 2024-01-18: qty 3

## 2024-01-18 MED ORDER — DOXYCYCLINE HYCLATE 100 MG PO TABS: 100.0000 mg | ORAL_TABLET | Freq: Two times a day (BID) | ORAL | 0 refills | Status: DC

## 2024-01-18 MED ORDER — PSEUDOEPH-BROMPHEN-DM 30-2-10 MG/5ML PO SYRP: 5.0000 mL | ORAL_SOLUTION | Freq: Four times a day (QID) | ORAL | 0 refills | Status: AC | PRN

## 2024-01-18 NOTE — ED Triage Notes (Signed)
Patient c/o being exposed to flu. Reports she is having symptoms of diarrhea, fever, and cough

## 2024-01-18 NOTE — ED Provider Notes (Signed)
[facility] Provider Note    Event Date/Time   First MD Initiated Contact with Patient 01/18/24 [phone]     (approximate)   History   No chief complaint on file.   HPI  Megan Solis is a 48 y.o. female presents emergency department with headache started today, was exposed to flu 2 days ago.  States have a cough and congestion.  Is a smoker.  Some wheezing.  No vomiting or diarrhea.  No known fever.      Physical Exam   Triage Vital Signs: ED Triage Vitals  Encounter Vitals Group     BP 01/18/24 0718 116/67     Systolic BP Percentile --      Diastolic BP Percentile --      Pulse Rate 01/18/24 0718 (!) 104     Resp 01/18/24 0718 18     Temp 01/18/24 0715 99.1 F (37.3 C)     Temp Source 01/18/24 0715 Oral     SpO2 01/18/24 0718 94 %     Weight 01/18/24 0716 150 lb (68 kg)     Height 01/18/24 0716 5\' 5"  (1.651 m)     Head Circumference --      Peak Flow --      Pain Score 01/18/24 0716 5     Pain Loc --      Pain Education --      Exclude from Growth Chart --     Most recent vital signs: Vitals:   01/18/24 0715 01/18/24 0718  BP:  116/67  Pulse:  (!) 104  Resp:  18  Temp: 99.1 F (37.3 C)   SpO2:  94%     General: Awake, no distress.   CV:  Good peripheral perfusion. regular rate and  rhythm Resp:  Normal effort. Lungs with wheezing in all lung fields Abd:  No distention.   Other:      ED Results / Procedures / Treatments   Labs (all labs ordered are listed, but only abnormal results are displayed) Labs Reviewed  RESP PANEL BY RT-PCR (RSV, FLU A&B, COVID)  RVPGX2     EKG     RADIOLOGY  cxr   PROCEDURES:   Procedures No chief complaint on file.     MEDICATIONS ORDERED IN ED: Medications  acetaminophen (TYLENOL) tablet 650 mg (has no administration in time range)  ipratropium-albuterol (DUONEB) 0.5-2.5 (3) MG/3ML nebulizer solution 3 mL (has no administration in time range)     IMPRESSION / MDM /  ASSESSMENT AND PLAN / ED COURSE  I reviewed the triage vital signs and the nursing notes.                              Differential diagnosis includes, but is not limited to, COVID, influenza, RSV, CAP  Patient's presentation is most consistent with acute illness / injury with system symptoms.   Respiratory panel reassuring  Due to the wheezing did give the patient DuoNeb while here in the ED.  Tylenol for headache.  Will also do chest x-ray to ensure patient does not have pneumonia.   Chest x-ray independently reviewed interpreted by me as having possible infiltrate in the lower lung, due to patient being a smoker, low-grade temp a little tachycardic we will go ahead and treat her with doxycycline.  Patient is also given Bromfed cough syrup.  I did tell her that if she was exposed to flu  she may actually not turn positive on the testing yet.  Due to the concerns of pneumonia and her history of pneumonia if she begins to get worse has a higher fever she will need to be rechecked.  Patient is in agreement treatment plan.  She has an inhaler at home.  Had improvement with the DuoNeb and lungs had decreased wheezing and increased air movement.  She was discharged in stable condition.   FINAL CLINICAL IMPRESSION(S) / ED DIAGNOSES   Final diagnoses:  Acute bronchitis, unspecified organism  Flu-like symptoms     Rx / DC Orders   ED Discharge Orders     None        Note:  This document was prepared using Dragon voice recognition software and may include unintentional dictation errors.    Faythe Ghee, PA-C 01/18/24 1610    Janith Lima, MD 01/18/24 [phone]

## 2024-03-11 IMAGING — CT CT CHEST-ABD-PELV W/ CM
3 of 5 series · 15 of 36 positions shown, 17 images · IV contrast (agent unspecified)
Comparison: 04/07/2018

CLINICAL DATA: Groin lymphadenopathy.  Lower abdominal pain

EXAM:
CT CHEST, ABDOMEN, AND PELVIS WITH CONTRAST
TECHNIQUE: Multidetector CT imaging of the chest, abdomen and pelvis was
performed following the standard protocol during bolus
administration of intravenous contrast.

[Series 2: cap with · axial · 0.82mm/px · z∈[-962,-452]mm · 9 of 128 slices shown, 11 images]
[im 13/128  mediastinal]
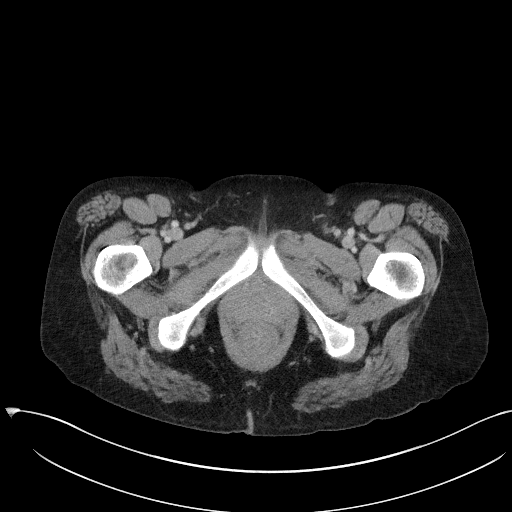
[im 13/128  bone]
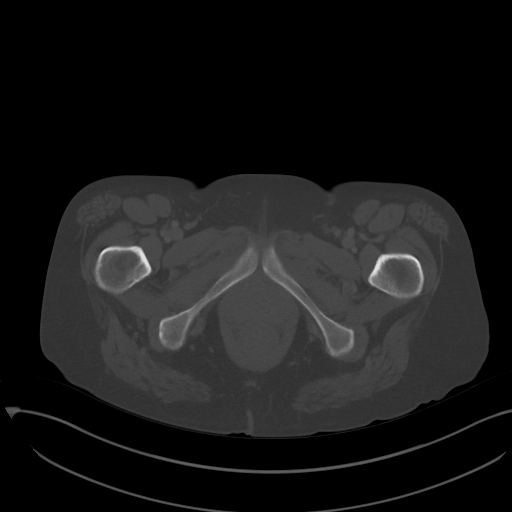
[im 26/128  mediastinal]
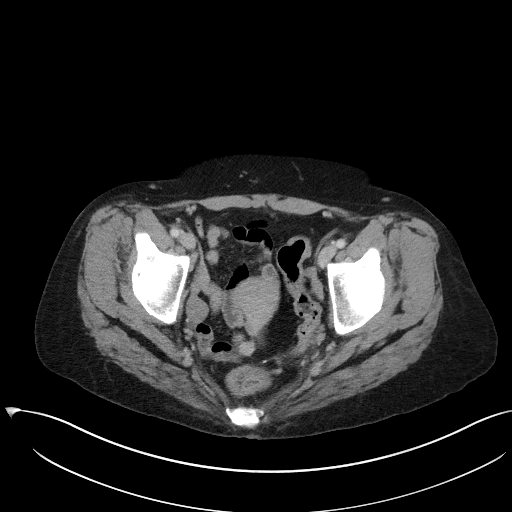
[im 39/128  mediastinal]
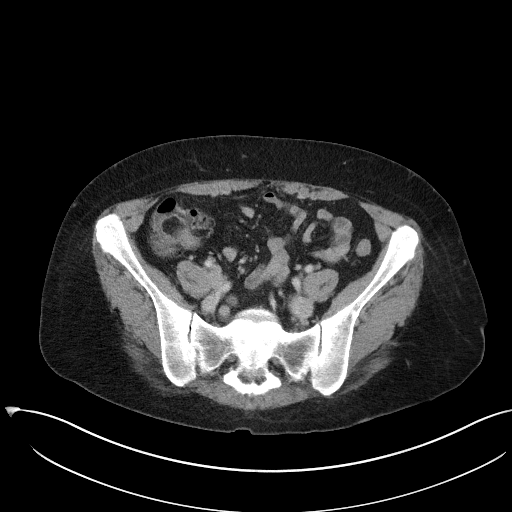
[im 51/128  mediastinal]
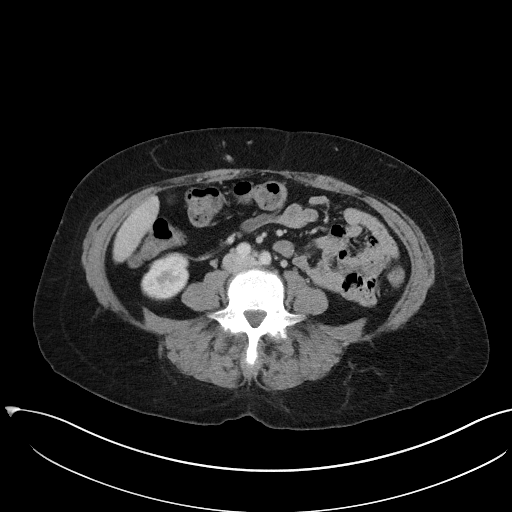
[im 64/128  mediastinal]
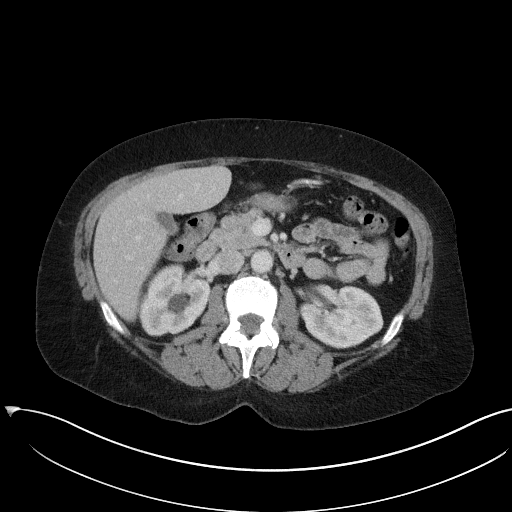
[im 77/128  mediastinal]
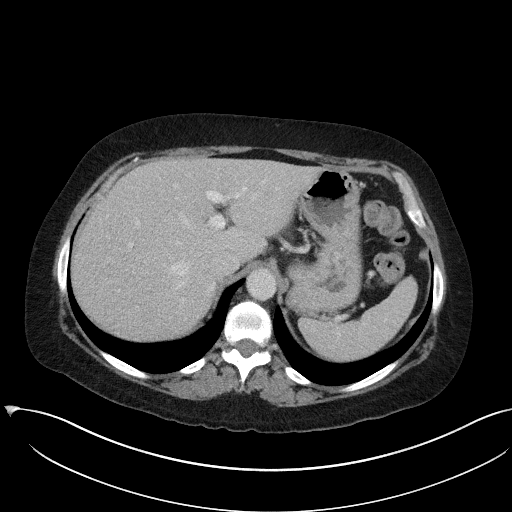
[im 89/128  mediastinal]
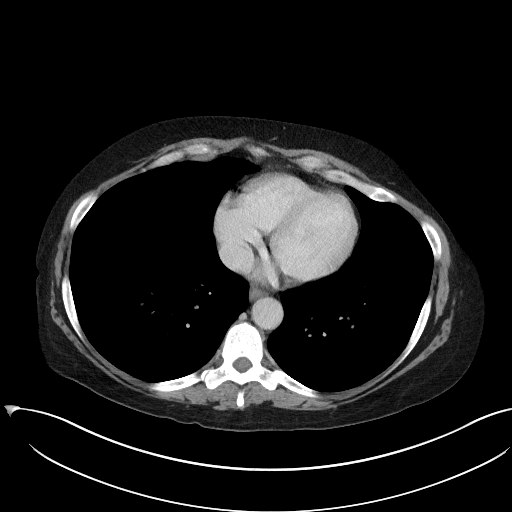
[im 102/128  mediastinal]
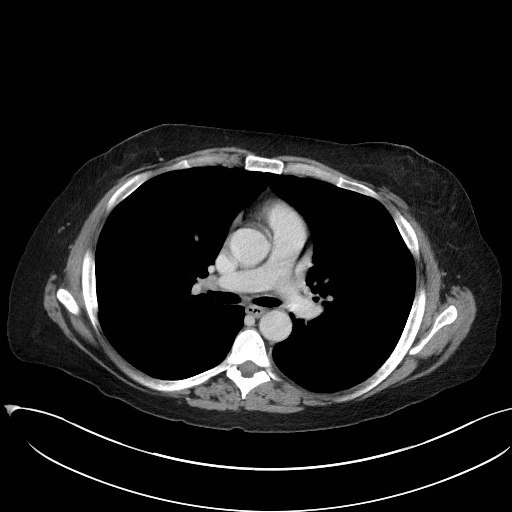
[im 115/128  mediastinal]
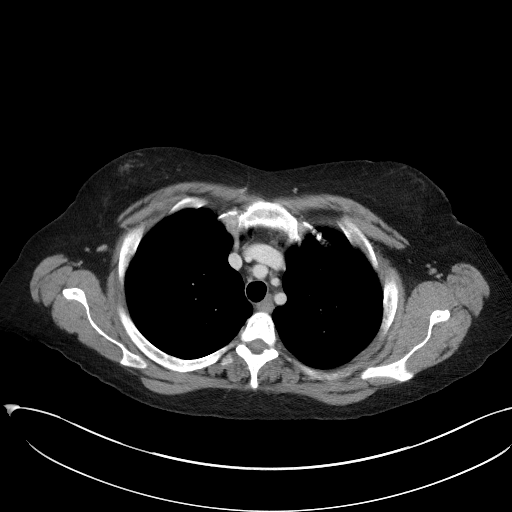
[im 115/128  bone]
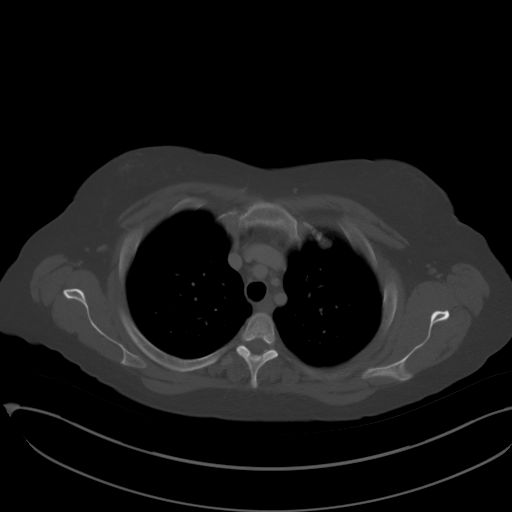

[Series 4: lung · axial · 0.82mm/px · z∈[-690,-614]mm · 3 of 164 slices shown]
[im 13/164  bone]
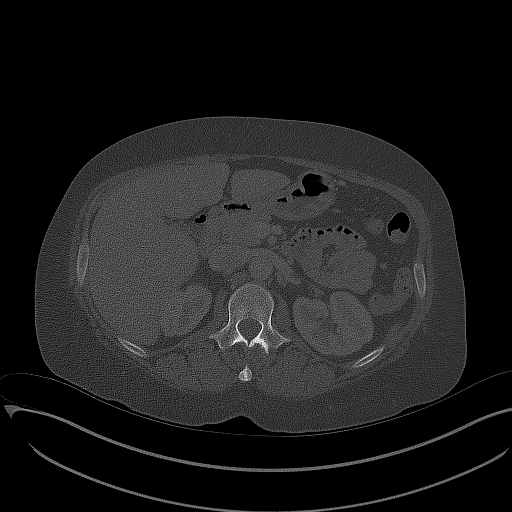
[im 38/164  bone]
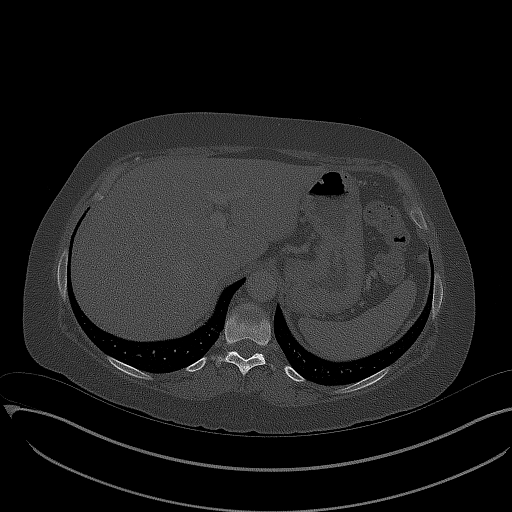
[im 51/164  bone]
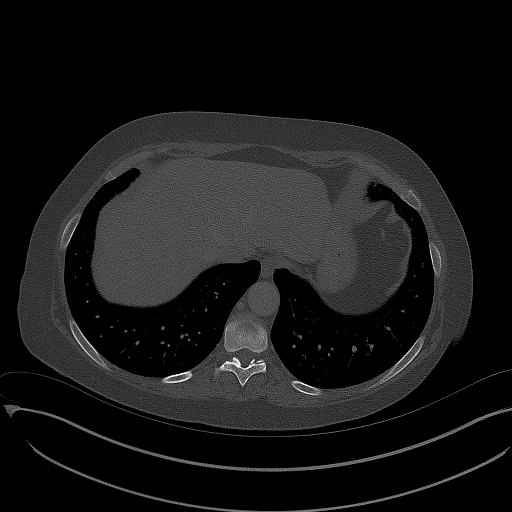

[Series 5: coronals · coronal · 0.81mm/px · 3 of 133 slices shown]
[im 27/133  mediastinal]
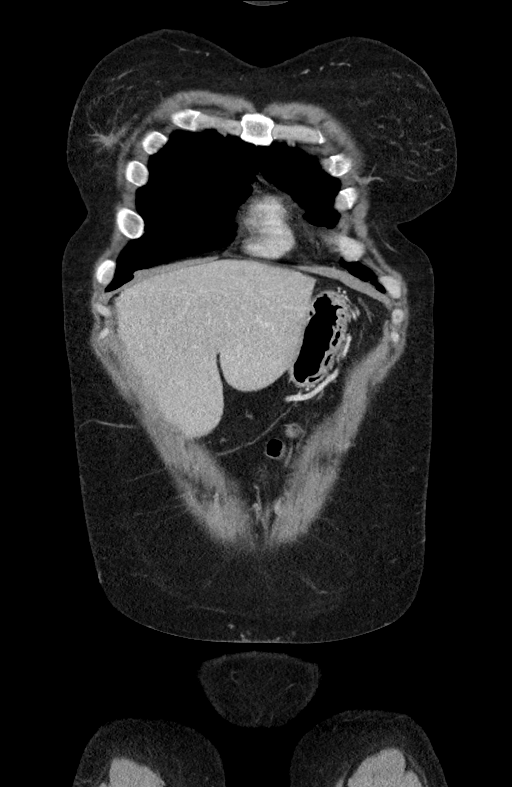
[im 53/133  mediastinal]
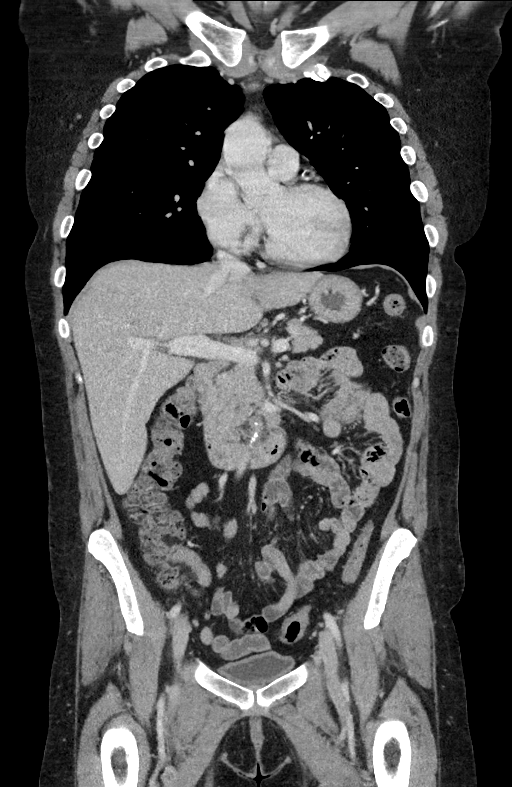
[im 80/133  mediastinal]
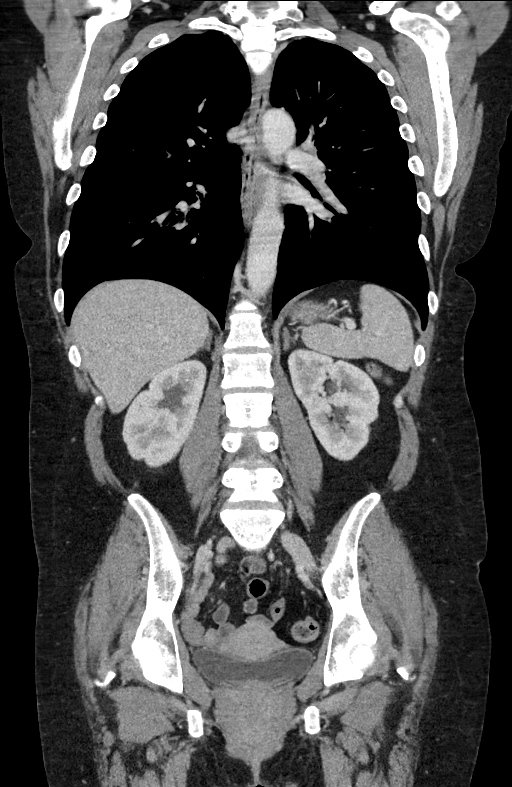

[15 of 36 positions shown; findings below may reference images not displayed]

RADIATION DOSE REDUCTION: This exam was performed according to the
departmental dose-optimization program which includes automated
exposure control, adjustment of the mA and/or kV according to
patient size and/or use of iterative reconstruction technique.

CONTRAST:  100mL OMNIPAQUE IOHEXOL 300 MG/ML  SOLN
FINDINGS: CT CHEST FINDINGS

Cardiovascular: Normal heart size. No pericardial effusion. Coronary
atherosclerosis which is multifocal.

Mediastinum/Nodes: Negative for adenopathy or mass.

Lungs/Pleura: Central airways are clear. Diffuse airway thickening.
There is no edema, consolidation, effusion, or pneumothorax.

Musculoskeletal: No acute or aggressive finding

CT ABDOMEN PELVIS FINDINGS

Hepatobiliary: No focal liver abnormality.No evidence of biliary
obstruction or stone.

Pancreas: Unremarkable.

Spleen: Unremarkable.

Adrenals/Urinary Tract: Negative adrenals. No hydronephrosis or
stone. Unremarkable bladder.

Stomach/Bowel:  No obstruction. No appendicitis.

Vascular/Lymphatic: No acute vascular abnormality. Notable for age
atheromatous calcification of the aorta and iliacs. Mildly
asymmetric left groin lymph node presumably associated with the
palpable complaint, measuring up to 13 mm. No worrisome or
generalized adenopathy.

Reproductive:No pathologic findings.

Other: No ascites or pneumoperitoneum.

Musculoskeletal: No acute abnormalities.
IMPRESSION: 1. Mild asymmetric enlargement of a left inguinal lymph node
correlating with the palpable complaint, usually reactive.
2. No acute intrathoracic or intra-abdominal finding.
3. Age advanced atherosclerosis including the coronary arteries.

## 2024-12-23 ENCOUNTER — Emergency Department: Payer: Self-pay

## 2024-12-23 ENCOUNTER — Emergency Department
Admission: EM | Admit: 2024-12-23 | Discharge: 2024-12-23 | Disposition: A | Discharge status: Home / Self Care | Payer: Self-pay | Attending: Emergency Medicine | Admitting: Emergency Medicine

## 2024-12-23 DIAGNOSIS — E876 Hypokalemia: Secondary | ICD-10-CM | POA: Insufficient documentation

## 2024-12-23 DIAGNOSIS — J181 Lobar pneumonia, unspecified organism: Secondary | ICD-10-CM | POA: Insufficient documentation

## 2024-12-23 DIAGNOSIS — J189 Pneumonia, unspecified organism: Secondary | ICD-10-CM

## 2024-12-23 DIAGNOSIS — I10 Essential (primary) hypertension: Secondary | ICD-10-CM | POA: Insufficient documentation

## 2024-12-23 DIAGNOSIS — D72829 Elevated white blood cell count, unspecified: Secondary | ICD-10-CM | POA: Insufficient documentation

## 2024-12-23 LAB — CBC
HCT: 39 % (ref 36.0–46.0)
Hemoglobin: 12.9 g/dL (ref 12.0–15.0)
MCH: 28.7 pg (ref 26.0–34.0)
MCHC: 33.1 g/dL (ref 30.0–36.0)
MCV: 86.7 fL (ref 80.0–100.0)
Platelets: 391 K/uL (ref 150–400)
RBC: 4.5 MIL/uL (ref 3.87–5.11)
RDW: 14.4 % (ref 11.5–15.5)
WBC: 20.9 K/uL — ABNORMAL HIGH (ref 4.0–10.5)
nRBC: 0 % (ref 0.0–0.2)

## 2024-12-23 LAB — BASIC METABOLIC PANEL WITH GFR
Anion gap: 13 (ref 5–15)
BUN: 8 mg/dL (ref 6–20)
CO2: 26 mmol/L (ref 22–32)
Calcium: 9.5 mg/dL (ref 8.9–10.3)
Chloride: 99 mmol/L (ref 98–111)
Creatinine, Ser: 0.54 mg/dL (ref 0.44–1.00)
GFR, Estimated: >60 mL/min
Glucose, Bld: 104 mg/dL — ABNORMAL HIGH (ref 70–99)
Potassium: 3.4 mmol/L — ABNORMAL LOW (ref 3.5–5.1)
Sodium: 139 mmol/L (ref 135–145)

## 2024-12-23 MED ORDER — AMOXICILLIN-POT CLAVULANATE 875-125 MG PO TABS: 1.0000 | ORAL_TABLET | Freq: Two times a day (BID) | ORAL | 0 refills | Status: AC

## 2024-12-23 MED ORDER — ALBUTEROL SULFATE (2.5 MG/3ML) 0.083% IN NEBU
2.5000 mg | INHALATION_SOLUTION | Freq: Once | RESPIRATORY_TRACT | Status: AC
  Administered 2024-12-23: 2.5 mg via RESPIRATORY_TRACT
  Filled 2024-12-23: qty 3

## 2024-12-23 MED ORDER — DOXYCYCLINE HYCLATE 100 MG PO CAPS: 100.0000 mg | ORAL_CAPSULE | Freq: Two times a day (BID) | ORAL | 0 refills | Status: AC

## 2024-12-23 NOTE — ED Notes (Signed)
 Patient refused Pulse Oximetry with ambulation. PA aware.

## 2024-12-23 NOTE — ED Triage Notes (Signed)
 Pt presents to the ED via POV from home with SHOB x4 days. Pt reports a productive cough of grayish brown mucus. Pt reports having N/V closer to christmas, but none since Sunday. Pt A&Ox4. Ambulatory.

## 2024-12-23 NOTE — Discharge Instructions (Signed)
 Your blood work showed a high white blood cell count which means you are fighting off an infection.  Your chest x-ray showed signs of early pneumonia but it has not developed into pneumonia yet.  I am going to treat with antibiotics to prevent it from becoming pneumonia.  Please take these medications as prescribed.  Make sure you take all the medication even if you begin to feel better.  Please return to the emergency department with any worsening symptoms like increased difficulty breathing, development of fever or any other symptom personally concerning to you.

## 2024-12-23 NOTE — ED Provider Notes (Signed)
 "  [facility] Provider Note    Event Date/Time   First MD Initiated Contact with Patient 12/23/24 1623     (approximate)   History   Shortness of Breath   HPI  Megan Solis is a 48 y.o. female with PMH of depression, alcohol abuse, seizures, hypertension, MRSA presents for evaluation of shortness of breath for 4 days.  Patient states that around Christmas she had some nausea, vomiting and diarrhea but that has improved.  She has not had any N/V/D since Sunday.  She reports she has worsening upper respiratory symptoms including productive cough with greenish-brown mucus and congestion.  Patient states that she gets very short of breath when walking just from her living room to her dining room.  She is a smoker but has not smoked today.      Physical Exam   Triage Vital Signs: ED Triage Vitals  Encounter Vitals Group     BP 12/23/24 1546 (!) 158/99     Girls Systolic BP Percentile --      Girls Diastolic BP Percentile --      Boys Systolic BP Percentile --      Boys Diastolic BP Percentile --      Pulse Rate 12/23/24 1546 (!) 111     Resp 12/23/24 1546 18     Temp 12/23/24 1546 99 F (37.2 C)     Temp Source 12/23/24 1546 Oral     SpO2 12/23/24 1546 96 %     Weight 12/23/24 1547 140 lb (63.5 kg)     Height 12/23/24 1547 5' 5 (1.651 m)     Head Circumference --      Peak Flow --      Pain Score 12/23/24 1547 0     Pain Loc --      Pain Education --      Exclude from Growth Chart --     Most recent vital signs: Vitals:   12/23/24 1645 12/23/24 1802  BP:  (!) 155/89  Pulse: (!) 105 80  Resp:  19  Temp:  99 F (37.2 C)  SpO2: 90% 90%   General: Awake, no distress.  CV:  Good peripheral perfusion.  Tachycardic but regular rhythm. Resp:  Normal effort.  Bilateral wheezing. Abd:  No distention.  Other:     ED Results / Procedures / Treatments   Labs (all labs ordered are listed, but only abnormal results are displayed) Labs  Reviewed  BASIC METABOLIC PANEL WITH GFR - Abnormal; Notable for the following components:      Result Value   Potassium 3.4 (*)    Glucose, Bld 104 (*)    All other components within normal limits  CBC - Abnormal; Notable for the following components:   WBC 20.9 (*)    All other components within normal limits   EKG  ED Provider interpretation: EKG shows NSR.  RADIOLOGY  Chest x-ray obtained, interpreted the images as well as reviewed the radiologist report.  Images suggest viral infection with questionable developing right lower lobe airspace opacity.  Radiologist recommended follow-up x-ray in 3 to 4 weeks to ensure resolution.  PROCEDURES:  Critical Care performed: No  Procedures   MEDICATIONS ORDERED IN ED: Medications  albuterol  (PROVENTIL ) (2.5 MG/3ML) 0.083% nebulizer solution 2.5 mg (2.5 mg Nebulization Given 12/23/24 1657)     IMPRESSION / MDM / ASSESSMENT AND PLAN / ED COURSE  I reviewed the triage vital signs and the nursing notes.  48 year old female presents for evaluation of shortness of breath.  Patient was tachycardic and hypertensive on initial assessment.  Patient NAD on exam.  Her tachycardia did improve without intervention.  Differential diagnosis includes, but is not limited to, flu, COVID, RSV, bronchitis, pneumonia, reactive airway disease.  Patient's presentation is most consistent with acute complicated illness / injury requiring diagnostic workup.  CBC notable for leukocytosis at 20.9.  BMP unremarkable.    Patient had some wheezing on exam so we will treat with albuterol  nebulizer.  Patient does meet sepsis criteria given her initial tachycardia and elevated white count.  I considered further workup including blood cultures and lactic acid.  Patient explained to the that she did not want to be admitted and if the blood cultures or lactic acid were to have results such that she should be admitted she would declined  admission so this lab work was not obtained.  Patient had a pulse ox of 90% when just laying in the stretcher.  I explained that I would like to get an ambulatory pulse ox on her to check to see if it is dropping with activity.  When nursing staff went to assess ambulatory pulse ox, patient refused stating after the breathing treatment this is the best she has felt.   I encouraged patient to allow us  to do further workup but she stated that she absolutely cannot stay in the hospital tonight as she has pets and her own mother at home to care for her.  Her chest x-ray showed a possible developing pneumonia and with her elevated white count I will treat this with oral antibiotics.  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  I discussed strict return precautions.  Patient was given a note for work.  She voiced understanding, all questions were answered and she was stable at discharge.    FINAL CLINICAL IMPRESSION(S) / ED DIAGNOSES   Final diagnoses:  Community acquired pneumonia of right lower lobe of lung     Rx / DC Orders   ED Discharge Orders          Ordered    amoxicillin -clavulanate (AUGMENTIN ) 875-125 MG tablet  2 times daily        12/23/24 1821    doxycycline  (VIBRAMYCIN ) 100 MG capsule  2 times daily        12/23/24 1821             Note:  This document was prepared using Dragon voice recognition software and may include unintentional dictation errors.   Cleaster Tinnie LABOR, PA-C 12/23/24 1845    Floy Roberts, MD 12/23/24 1850  "
